# Patient Record
Sex: Female | Born: 1955 | Race: Black or African American | Hispanic: No | Smoking: Never smoker
Health system: Southern US, Community
[De-identification: ages and names within clinical notes are randomized; demographics above are authoritative.]

## PROBLEM LIST (undated history)

## (undated) DIAGNOSIS — F419 Anxiety disorder, unspecified: Secondary | ICD-10-CM

## (undated) DIAGNOSIS — F32A Depression, unspecified: Secondary | ICD-10-CM

## (undated) DIAGNOSIS — F329 Major depressive disorder, single episode, unspecified: Secondary | ICD-10-CM

## (undated) HISTORY — DX: Depression, unspecified: F32.A

## (undated) HISTORY — PX: GASTRIC BYPASS: SHX52

## (undated) HISTORY — DX: Anxiety disorder, unspecified: F41.9

## (undated) HISTORY — DX: Major depressive disorder, single episode, unspecified: F32.9

---

## 2015-11-07 ENCOUNTER — Encounter (INDEPENDENT_AMBULATORY_CARE_PROVIDER_SITE_OTHER): Payer: Self-pay

## 2015-11-07 ENCOUNTER — Ambulatory Visit: Payer: Self-pay

## 2016-11-12 ENCOUNTER — Encounter: Payer: Self-pay | Admitting: Pharmacist

## 2016-12-17 ENCOUNTER — Ambulatory Visit: Payer: Self-pay | Admitting: Pharmacist

## 2016-12-17 ENCOUNTER — Encounter (INDEPENDENT_AMBULATORY_CARE_PROVIDER_SITE_OTHER): Payer: Self-pay

## 2016-12-17 ENCOUNTER — Encounter: Payer: Self-pay | Admitting: Pharmacist

## 2016-12-17 VITALS — BP 122/80 | Ht 62.0 in | Wt 169.0 lb

## 2016-12-17 DIAGNOSIS — Z79899 Other long term (current) drug therapy: Secondary | ICD-10-CM

## 2016-12-17 NOTE — Progress Notes (Addendum)
  [facility] Visit Note  Patient: Caroline Serrano MRN: [id number] Date of Birth: 1956-07-16 PCP: Sandrea Hughsubio, Jessica, NP   Caroline Serrano 61 y.o. female presents for an initial MTM visit today.  BP 122/80   Ht 5\' 2"  (1.575 m)   Wt 169 lb (76.7 kg)   BMI 30.91 kg/m   Patient Information   Past Medical History:  Diagnosis Date  . Anxiety   . Depression       Past Surgical History:  Procedure Laterality Date  . GASTRIC BYPASS       Family History  Problem Relation Age of Onset  . Hypertension Mother   . Stroke Mother   . Hypertension Father   . Diabetes Paternal Grandmother     New Diagnoses (since last visit): N/A  Family Support: recently divorced, has a close friend, lives with a roommate  Lifestyle Diet: eats several small meals Breakfast: scrambled egg toast, ceral Lunch:meat and veggies Dinner:lighter, lots of fruit Drinks:water, only 1 coca-[employer]a   Current Exercise Habits: Home exercise routine;The patient does not participate in regular exercise at present       History  Alcohol Use  . Yes    Comment: only occasional      History  Smoking Status  . Never Smoker  Smokeless Tobacco  . Never Used      Health Maintenance  Topic Date Due  . Hepatitis C Screening  01956-09-13  . HIV Screening  06/26/1970  . TETANUS/TDAP  06/26/1974  . PAP SMEAR  06/26/1976  . MAMMOGRAM  06/26/2005  . COLONOSCOPY  06/26/2005  . ZOSTAVAX  06/27/2015  . INFLUENZA VACCINE  06/02/2016   Prior to Admission medications   Medication Sig Start Date End Date Taking? Authorizing Provider  amitriptyline (ELAVIL) 50 MG tablet Take 100 mg by mouth at bedtime. 06/08/16  Yes Sandrea HughsJessica Rubio, NP  amLODipine (NORVASC) 10 MG tablet Take 10 mg by mouth daily. 10/20/16  Yes Sandrea HughsJessica Rubio, NP  buPROPion (WELLBUTRIN XL) 150 MG 24 hr tablet Take 150 mg by mouth daily. 06/08/16  Yes Sandrea HughsJessica Rubio, NP  busPIRone (BUSPAR) 15 MG tablet Take 15 mg by mouth 3 (three) times  daily.   Yes Historical Provider, MD  lisinopril (PRINIVIL,ZESTRIL) 40 MG tablet Take 40 mg by mouth daily. 01/01/16  Yes Sandrea HughsJessica Rubio, NP  venlafaxine XR (EFFEXOR-XR) 75 MG 24 hr capsule Take 225 mg by mouth daily with breakfast. 01/02/16  Yes Sandrea HughsJessica Rubio, NP     Assessment and Plan:  Compliance/Adherance:  Pt uses pillbox to organize medications. She knew how she takes each medication and we reviewed what each medication is for. Counseled on the importance of adherence and a goal going forward is to obtain refills on time so there are no gaps in therapy. Requested if pt has anxiety to call me and I will be happy to help her personally.  HTN: pt BP today is under goal of 130/80. Current regimen jis effective and pt is well controlled.  Depression/Anxiety: pt feels that she is well controlled with minimal SEs, but has noticed weight gain. This could be due to amitriptyline and I will recommend to PCP to change to trazodone which pt has taken before with minimal SEs.  Denice Paradisehristan Saja Bartolini, PharmD, RPh [facility]) [phone]

## 2017-11-23 ENCOUNTER — Telehealth: Payer: Self-pay | Admitting: Pharmacy Technician

## 2017-11-23 NOTE — Telephone Encounter (Signed)
Patient eligible to receive medication assistance at [facility] through 2019, as long as eligibility requirements continue to be met.  [place] [facility]

## 2017-11-30 ENCOUNTER — Other Ambulatory Visit: Payer: Self-pay

## 2017-11-30 ENCOUNTER — Encounter (INDEPENDENT_AMBULATORY_CARE_PROVIDER_SITE_OTHER): Payer: Self-pay

## 2017-11-30 ENCOUNTER — Ambulatory Visit: Payer: Medicaid Other

## 2017-11-30 VITALS — BP 128/78 | Ht 62.0 in | Wt 159.0 lb

## 2017-11-30 DIAGNOSIS — Z79899 Other long term (current) drug therapy: Secondary | ICD-10-CM

## 2017-11-30 NOTE — Progress Notes (Signed)
[facility] Visit Note  Patient: Caroline Serrano MRN: [id number] Date of Birth: 1955-11-16 PCP: Sandrea Hughsubio, Jessica, NP   Caroline Serrano 62 y.o. female presents for a medication therapy management visit today with the pharmacist.  BP 128/78 (BP Location: Right Arm)   Ht 5\' 2"  (1.575 m)   Wt 159 lb (72.1 kg)   BMI 29.08 kg/m   Patient Information   Past Medical History:  Diagnosis Date  . Anxiety   . Depression       Past Surgical History:  Procedure Laterality Date  . GASTRIC BYPASS       Family History  Problem Relation Age of Onset  . Hypertension Mother   . Stroke Mother   . Diabetes Mother   . Hypertension Father   . Diabetes Paternal Grandmother    Outpatient Encounter Medications as of 11/30/2017  Medication Sig  . amLODipine (NORVASC) 10 MG tablet Take 10 mg by mouth daily.  Marland Kitchen. buPROPion (WELLBUTRIN XL) 300 MG 24 hr tablet Take 300 mg by mouth daily.   Marland Kitchen. lisinopril (PRINIVIL,ZESTRIL) 40 MG tablet Take 40 mg by mouth daily.  . metoprolol succinate (TOPROL-XL) 25 MG 24 hr tablet Take 25 mg by mouth daily.  . traZODone (DESYREL) 100 MG tablet Take 100 mg by mouth at bedtime as needed for sleep.  Marland Kitchen. venlafaxine XR (EFFEXOR-XR) 75 MG 24 hr capsule Take 225 mg by mouth daily with breakfast.  . [DISCONTINUED] amitriptyline (ELAVIL) 50 MG tablet Take 100 mg by mouth at bedtime.  . [DISCONTINUED] busPIRone (BUSPAR) 15 MG tablet Take 15 mg by mouth 3 (three) times daily.   No facility-administered encounter medications on file as of 11/30/2017.     Family Support: Good; has good friend that she lives with.   Lifestyle Diet: 2.5 meals a day Breakfast: eggs, toast Dinner: chicken, salad, green beans, potato, rice  Drinks: Coke, water  Snacks: fruit   Current Exercise Habits: The patient does not participate in regular exercise at present  Exercise limited by: None identified    Social History   Substance and Sexual Activity   Alcohol Use Yes   Comment: only occasional      Social History   Tobacco Use  Smoking Status Never Smoker  Smokeless Tobacco Never Used      Health Maintenance  Topic Date Due  . Hepatitis C Screening  01956-01-15  . HIV Screening  06/26/1970  . TETANUS/TDAP  06/26/1974  . PAP SMEAR  06/26/1976  . MAMMOGRAM  06/26/2005  . COLONOSCOPY  06/26/2005  . INFLUENZA VACCINE  06/02/2017   Health Maintenance/Date Completed  Last ED visit: has been 6 years  Last Visit to PCP: [facility]; last saw 2 wks ago  Next Visit to PCP: No follow ups scheduled at this time Specialist Visit: None  Dental Exam: Has appointment next wk  Eye Exam: 04/2017 - has occasional problem with double vision  Pelvic/PAP Exam: 1994 had hysterectomy  Mammogram: 10 yrs since last; has appointment in March DEXA: 10 yrs Colonoscopy: 6 yrs ago - Dr. Seward Carolubio is going to refer to get another one  Flu Vaccine: 2 wks  Pneumonia Vaccine: 10 yrs ago    Assessment and Plan:  Anxiety/Depression: bupropion, venlafaxine. Patient states that she is doing very well on this medication combination. She says that she has not experienced any issues with SE's and that she is very pleased with this combination. Current regimen is appropriate.   Sleep:  trazodone. Patient states that she has some issues with sleep. She states that sometimes she does not sleep well but that the trazodone works well for her. She states that she takes this PRN instead of scheduled.   HTN: amlodipine, lisinopril, metoprolol succinate. Patient's BP is well-controlled today with BP showing 128/78. She is excited by this. She states that she does not have any issues with hypotension.   Patient is a 62 yo female who is compliant with her medications and knows them well. She states that she is very satisfied with her current regimens. She is currently being followed [facility] by FNP Sandrea Hughs who is  managing her medications and arranging referrals for her to get her health maintenance visits (colonoscopy, etc). Patient is doing very well and is invested in her own care. As patient is very stable, I scheduled a 1 yr follow up MTM for her.   Yolanda Bonine, PharmD Pharmacy Resident

## 2018-01-21 ENCOUNTER — Other Ambulatory Visit: Payer: Self-pay | Admitting: Primary Care

## 2018-01-21 DIAGNOSIS — Z1239 Encounter for other screening for malignant neoplasm of breast: Secondary | ICD-10-CM

## 2018-01-24 ENCOUNTER — Ambulatory Visit: Payer: Self-pay | Attending: Oncology | Admitting: *Deleted

## 2018-01-24 ENCOUNTER — Ambulatory Visit
Admission: RE | Admit: 2018-01-24 | Discharge: 2018-01-24 | Disposition: A | Discharge status: Home / Self Care | Payer: Self-pay | Source: Ambulatory Visit | Attending: Primary Care | Admitting: Primary Care

## 2018-01-24 DIAGNOSIS — Z0000 Encounter for general adult medical examination without abnormal findings: Secondary | ICD-10-CM

## 2018-01-24 DIAGNOSIS — Z1231 Encounter for screening mammogram for malignant neoplasm of breast: Secondary | ICD-10-CM | POA: Insufficient documentation

## 2018-01-24 DIAGNOSIS — Z1239 Encounter for other screening for malignant neoplasm of breast: Secondary | ICD-10-CM

## 2018-01-24 NOTE — Progress Notes (Signed)
Patient was rescreened for [facility] eligibility and was not eligible since he is a transgender female.  Mammogram will be provided through [employer] since he meet financial eligibility.  Will follow up to see if mammogram is normal navigate if appropriate.

## 2018-01-28 ENCOUNTER — Other Ambulatory Visit: Payer: Self-pay | Admitting: *Deleted

## 2018-01-28 ENCOUNTER — Inpatient Hospital Stay
Admission: RE | Admit: 2018-01-28 | Discharge: 2018-01-28 | Disposition: A | Discharge status: Home / Self Care | Payer: Self-pay | Source: Ambulatory Visit | Attending: *Deleted | Admitting: *Deleted

## 2018-01-28 DIAGNOSIS — Z9289 Personal history of other medical treatment: Secondary | ICD-10-CM

## 2018-12-01 ENCOUNTER — Encounter (INDEPENDENT_AMBULATORY_CARE_PROVIDER_SITE_OTHER): Payer: Self-pay

## 2018-12-01 ENCOUNTER — Other Ambulatory Visit: Payer: Self-pay

## 2018-12-01 ENCOUNTER — Telehealth: Payer: Self-pay | Admitting: Pharmacy Technician

## 2018-12-01 ENCOUNTER — Encounter: Payer: Self-pay | Admitting: Pharmacist

## 2018-12-01 ENCOUNTER — Ambulatory Visit: Payer: Medicaid Other | Admitting: Pharmacist

## 2018-12-01 VITALS — BP 130/83 | Ht 62.0 in | Wt 160.0 lb

## 2018-12-01 DIAGNOSIS — Z79899 Other long term (current) drug therapy: Secondary | ICD-10-CM

## 2018-12-01 NOTE — Telephone Encounter (Signed)
Received 2020 proof of income.  Patient eligible to receive medication assistance at [facility] as long as eligibility requirements continue to be met.  [place] [facility]

## 2018-12-01 NOTE — Progress Notes (Addendum)
[facility] Visit Note  Patient: Caroline Serrano MRN: [id number] Date of Birth: 1956-07-18 PCP: Caroline Hughs, NP   Caroline Serrano 63 y.o. transgender female presents for a yearly f/u MTM visit today.  BP 130/83 (BP Location: Right Arm, Patient Position: Sitting, Cuff Size: Normal)   Ht 5\' 2"  (1.575 m)   Wt 160 lb (72.6 kg)   BMI 29.26 kg/m   Patient Information   Past Medical History:  Diagnosis Date  . Anxiety   . Depression       Past Surgical History:  Procedure Laterality Date  . GASTRIC BYPASS       Family History  Problem Relation Age of Onset  . Hypertension Mother   . Stroke Mother   . Diabetes Mother   . Hypertension Father   . Diabetes Paternal Grandmother             Social History   Substance and Sexual Activity  Alcohol Use Yes   Comment: only occasional      Social History   Tobacco Use  Smoking Status Never Smoker  Smokeless Tobacco Never Used      Health Maintenance  Topic Date Due  . Hepatitis C Screening  1956/08/29  . HIV Screening  06/26/1970  . TETANUS/TDAP  06/26/1974  . PAP SMEAR-Modifier  06/26/1976  . COLONOSCOPY  06/26/2005  . INFLUENZA VACCINE  06/02/2018  . MAMMOGRAM  01/25/2020   Health Maintenance/Date Completed  Last ED visit: n/a Last Visit to PCP: dec Next Visit to PCP: w/in 3 mos Specialist Visit:neurologist (double vision); specialist about testosterone levels (high) Dental Exam:  3 weeks ago Eye Exam: Oct 2019 Mammogram: 12/2017 DEXA: not recently Colonoscopy: w/in last 5 years Flu Vaccine: 06/2018 Pneumonia Vaccine: 10 years   should have sent ref back in nov - send req to rubio  Outpatient Encounter Medications as of 12/01/2018  Medication Sig  . amLODipine (NORVASC) 10 MG tablet Take 10 mg by mouth daily.  Marland Kitchen aspirin EC 81 MG tablet Take 81 mg by mouth daily.  Marland Kitchen buPROPion (WELLBUTRIN XL) 300 MG 24 hr tablet Take 300 mg by mouth daily.   .  Cyanocobalamin-Salcaprozate (ELIGEN B12) 1000-100 MCG-MG TABS Take 1,000 mcg by mouth every morning.  Marland Kitchen lisinopril (PRINIVIL,ZESTRIL) 40 MG tablet Take 40 mg by mouth daily.  . metoprolol succinate (TOPROL-XL) 25 MG 24 hr tablet Take 25 mg by mouth daily.  Marland Kitchen testosterone cypionate (DEPO-TESTOSTERONE) 200 MG/ML injection Inject 40 mg as directed once a week.  . traZODone (DESYREL) 100 MG tablet Take 100 mg by mouth at bedtime as needed for sleep.  Marland Kitchen venlafaxine XR (EFFEXOR-XR) 75 MG 24 hr capsule Take 225 mg by mouth daily with breakfast.   No facility-administered encounter medications on file as of 12/01/2018.     Assessment and Plan:  Compliance/Adherance: knows names, frequencies, and indications of all medications with very little prompting. States only misses doses maybe once every 3 months. Consistent getting refills on time. Patient is doing very well and is invested in own care.  Anxiety/Depression: bupropion, venlafaxine, hydroxyzine 10mg  prn added in Dec/2019. Patient states that he is doing very well on this medication combination. States has not experienced any issues with SE's Current regimen is appropriate. uses the hydroxyzine only as needed and it helps  Sleep: trazodone.  States takes this PRN instead of scheduled and has not had to use it very often.  HTN: amlodipine, lisinopril, metoprolol succinate. Patient's BP is well-controlled today at 130/83,  right at goal of <130/80.   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 States that SEs are not a problem but levels have been running higher than a cisgender males would and so he has been referred to a specialist.  RTC: 1 year  Denice Paradisehristan , PharmD, RPh [facility]) [phone]

## 2019-12-07 ENCOUNTER — Other Ambulatory Visit: Payer: Medicaid Other

## 2020-01-01 ENCOUNTER — Other Ambulatory Visit: Payer: Self-pay

## 2020-01-01 ENCOUNTER — Ambulatory Visit: Payer: Medicaid Other

## 2020-01-01 DIAGNOSIS — Z79899 Other long term (current) drug therapy: Secondary | ICD-10-CM

## 2020-01-01 NOTE — Progress Notes (Signed)
[facility] Visit Note  Patient: Caroline Serrano MRN: [id number] Date of Birth: Jul 23, 1956 PCP: Freddy Finner, NP   Rutherford Limerick 64 y.o. adult presents for a telephone medication therapy management visit with the pharmacist today. Patient identity confirmed using two patient identifiers.   There were no vitals taken for this visit.  Patient Information   Past Medical History:  Diagnosis Date  . Anxiety   . Depression       Past Surgical History:  Procedure Laterality Date  . GASTRIC BYPASS       Family History  Problem Relation Age of Onset  . Hypertension Mother   . Stroke Mother   . Diabetes Mother   . Hypertension Father   . Diabetes Paternal Grandmother     New Diagnoses (since last visit): None    Social History   Substance and Sexual Activity  Alcohol Use Yes   Comment: only occasional   1.5 drinks/week (beer)   Social History   Tobacco Use  Smoking Status Never Smoker  Smokeless Tobacco Never Used   Denies tobacco use and illicit substance use   Health Maintenance  Topic Date Due  . Hepatitis C Screening  [birth year]  . HIV Screening  06/26/1970  . TETANUS/TDAP  06/26/1974  . PAP SMEAR-Modifier  06/26/1976  . COLONOSCOPY  06/26/2005  . INFLUENZA VACCINE  06/03/2019  . MAMMOGRAM  01/25/2020   Outpatient Encounter Medications as of 01/01/2020  Medication Sig  . amLODipine (NORVASC) 10 MG tablet Take 10 mg by mouth daily.  Marland Kitchen aspirin EC 81 MG tablet Take 81 mg by mouth daily.  . Biotin 1 MG CAPS Take 5 mg by mouth 2 (two) times daily.  Marland Kitchen buPROPion (WELLBUTRIN XL) 300 MG 24 hr tablet Take 300 mg by mouth daily.   . chlorthalidone (HYGROTON) 25 MG tablet Take 50 mg by mouth daily.  . cyanocobalamin 1000 MCG tablet Take 5,000 mcg by mouth daily.  . hydrOXYzine (ATARAX/VISTARIL) 10 MG tablet Take 10 mg by mouth 3 (three) times daily as needed.  Marland Kitchen lisinopril (PRINIVIL,ZESTRIL) 40 MG tablet Take 40 mg by mouth  daily.  Marland Kitchen testosterone cypionate (DEPO-TESTOSTERONE) 200 MG/ML injection Inject 6 mg as directed once a week.   . traZODone (DESYREL) 100 MG tablet Take 100 mg by mouth at bedtime as needed for sleep.  Marland Kitchen venlafaxine XR (EFFEXOR-XR) 75 MG 24 hr capsule Take 225 mg by mouth daily with breakfast.  . metoprolol succinate (TOPROL-XL) 25 MG 24 hr tablet Take 25 mg by mouth daily.  . [DISCONTINUED] Cyanocobalamin-Salcaprozate (ELIGEN B12) 1000-100 MCG-MG TABS Take 1,000 mcg by mouth every morning.   No facility-administered encounter medications on file as of 01/01/2020.    Health Maintenance/Date Completed  Last ED visit: Denies recent ED visits Last Visit to PCP: ~1 month ago Next Visit to PCP: Appointments q2-3 months Specialist Visit: N/A Dental Exam: Yes Eye Exam: Wears contacts.  Pelvic/PAP Exam: Did not ask Mammogram: Did not ask DEXA: History of osteoporosis. No history of fractures. Has been on bisphosphonate in the past.  Colonoscopy: Due Flu Vaccine: Yes Pneumonia Vaccine: Yes COVID-19 Vaccine: Never Shingrix Vaccine: Never  Assessment and Plan:  Compliance/Adherance:  -Not requiring refills on medications at this time -Reports adherence to prescribed medications -Does not utilize a pillbox but has a system that works  Anxiety/Depression: bupropion, venlafaxine, hydroxyzine 10mg  prn added in Dec/2019. Patient reports symptoms are well-controlled. No adverse effects of therapy.  Sleep: trazodone.  Takes this PRN and  not requiring frequently.  HTN: amlodipine, lisinopril, chlorthalidone. Checks blood pressure every other day. Reports that it typically ranges 140s/80s, goal <130/80. Denies signs/symptoms of hypotension (dizziness, faintness) or hypertension (headache, facial flushing). Encouraged patient to stay well hydrated while on chlorthalidone. Counseled patient on decreasing foods high in sodium.  Vitamin B12 deficiency: Cyanocobalamin 5000 mcg daily  Gender  transition: on depo-testosterone 6 mg weekly.  RTC: 1 year  Dorothea Ogle Pharmacy Resident 01 January 2020

## 2020-01-03 ENCOUNTER — Other Ambulatory Visit: Payer: Self-pay

## 2020-03-04 ENCOUNTER — Telehealth: Payer: Self-pay | Admitting: Pharmacy Technician

## 2020-03-04 NOTE — Telephone Encounter (Signed)
Received updated proof of income.  Patient eligible to receive medication assistance at [facility] until June 02, 2020.  Patient eligible for Medicare Part D beginning June 02, 2020.  Patient provided with information about how to sign-up for L.I.S and to be screened for a Medicare Savings Plan.  Sherilyn Dacosta Care Manager [facility]

## 2020-07-11 ENCOUNTER — Other Ambulatory Visit: Payer: Self-pay | Admitting: Primary Care

## 2020-07-11 DIAGNOSIS — Z0000 Encounter for general adult medical examination without abnormal findings: Secondary | ICD-10-CM

## 2020-09-05 ENCOUNTER — Ambulatory Visit
Admission: RE | Admit: 2020-09-05 | Discharge: 2020-09-05 | Disposition: A | Discharge status: Home / Self Care | Payer: Medicare HMO | Source: Ambulatory Visit | Attending: Primary Care | Admitting: Primary Care

## 2020-09-05 ENCOUNTER — Other Ambulatory Visit: Payer: Self-pay

## 2020-09-05 DIAGNOSIS — Z780 Asymptomatic menopausal state: Secondary | ICD-10-CM | POA: Diagnosis not present

## 2020-09-05 DIAGNOSIS — Z0000 Encounter for general adult medical examination without abnormal findings: Secondary | ICD-10-CM

## 2020-09-05 DIAGNOSIS — M8589 Other specified disorders of bone density and structure, multiple sites: Secondary | ICD-10-CM | POA: Insufficient documentation

## 2020-09-05 DIAGNOSIS — Z13820 Encounter for screening for osteoporosis: Secondary | ICD-10-CM | POA: Diagnosis not present

## 2020-11-05 ENCOUNTER — Emergency Department: Payer: Medicare HMO

## 2020-11-05 ENCOUNTER — Inpatient Hospital Stay
Admission: EM | Admit: 2020-11-05 | Discharge: 2020-11-09 | DRG: 917 | Disposition: A | Discharge status: Home Health | Payer: Medicare HMO | Attending: Internal Medicine | Admitting: Internal Medicine

## 2020-11-05 DIAGNOSIS — T50992A Poisoning by other drugs, medicaments and biological substances, intentional self-harm, initial encounter: Secondary | ICD-10-CM | POA: Diagnosis not present

## 2020-11-05 DIAGNOSIS — E876 Hypokalemia: Secondary | ICD-10-CM | POA: Diagnosis present

## 2020-11-05 DIAGNOSIS — Z8249 Family history of ischemic heart disease and other diseases of the circulatory system: Secondary | ICD-10-CM

## 2020-11-05 DIAGNOSIS — Z20822 Contact with and (suspected) exposure to covid-19: Secondary | ICD-10-CM | POA: Diagnosis present

## 2020-11-05 DIAGNOSIS — R569 Unspecified convulsions: Secondary | ICD-10-CM | POA: Diagnosis present

## 2020-11-05 DIAGNOSIS — Z23 Encounter for immunization: Secondary | ICD-10-CM

## 2020-11-05 DIAGNOSIS — Z79899 Other long term (current) drug therapy: Secondary | ICD-10-CM

## 2020-11-05 DIAGNOSIS — F10229 Alcohol dependence with intoxication, unspecified: Secondary | ICD-10-CM | POA: Diagnosis present

## 2020-11-05 DIAGNOSIS — R4182 Altered mental status, unspecified: Secondary | ICD-10-CM

## 2020-11-05 DIAGNOSIS — X58XXXA Exposure to other specified factors, initial encounter: Secondary | ICD-10-CM | POA: Diagnosis present

## 2020-11-05 DIAGNOSIS — Z7982 Long term (current) use of aspirin: Secondary | ICD-10-CM

## 2020-11-05 DIAGNOSIS — E512 Wernicke's encephalopathy: Secondary | ICD-10-CM

## 2020-11-05 DIAGNOSIS — Z833 Family history of diabetes mellitus: Secondary | ICD-10-CM

## 2020-11-05 DIAGNOSIS — Z9884 Bariatric surgery status: Secondary | ICD-10-CM

## 2020-11-05 DIAGNOSIS — F419 Anxiety disorder, unspecified: Secondary | ICD-10-CM | POA: Diagnosis present

## 2020-11-05 DIAGNOSIS — R748 Abnormal levels of other serum enzymes: Secondary | ICD-10-CM | POA: Diagnosis present

## 2020-11-05 DIAGNOSIS — F1010 Alcohol abuse, uncomplicated: Secondary | ICD-10-CM | POA: Diagnosis not present

## 2020-11-05 DIAGNOSIS — R45851 Suicidal ideations: Secondary | ICD-10-CM | POA: Diagnosis present

## 2020-11-05 DIAGNOSIS — Z820 Family history of epilepsy and other diseases of the nervous system: Secondary | ICD-10-CM

## 2020-11-05 DIAGNOSIS — Y92009 Unspecified place in unspecified non-institutional (private) residence as the place of occurrence of the external cause: Secondary | ICD-10-CM

## 2020-11-05 DIAGNOSIS — Z823 Family history of stroke: Secondary | ICD-10-CM

## 2020-11-05 DIAGNOSIS — I10 Essential (primary) hypertension: Secondary | ICD-10-CM | POA: Diagnosis present

## 2020-11-05 DIAGNOSIS — S0101XA Laceration without foreign body of scalp, initial encounter: Secondary | ICD-10-CM | POA: Diagnosis present

## 2020-11-05 DIAGNOSIS — G9341 Metabolic encephalopathy: Secondary | ICD-10-CM | POA: Diagnosis present

## 2020-11-05 DIAGNOSIS — F10939 Alcohol use, unspecified with withdrawal, unspecified: Secondary | ICD-10-CM

## 2020-11-05 DIAGNOSIS — A419 Sepsis, unspecified organism: Secondary | ICD-10-CM

## 2020-11-05 DIAGNOSIS — R6510 Systemic inflammatory response syndrome (SIRS) of non-infectious origin without acute organ dysfunction: Secondary | ICD-10-CM | POA: Diagnosis present

## 2020-11-05 DIAGNOSIS — K709 Alcoholic liver disease, unspecified: Secondary | ICD-10-CM | POA: Diagnosis present

## 2020-11-05 DIAGNOSIS — Z886 Allergy status to analgesic agent status: Secondary | ICD-10-CM

## 2020-11-05 DIAGNOSIS — F332 Major depressive disorder, recurrent severe without psychotic features: Secondary | ICD-10-CM | POA: Diagnosis present

## 2020-11-05 DIAGNOSIS — F1020 Alcohol dependence, uncomplicated: Secondary | ICD-10-CM

## 2020-11-05 DIAGNOSIS — F10239 Alcohol dependence with withdrawal, unspecified: Secondary | ICD-10-CM | POA: Diagnosis present

## 2020-11-05 DIAGNOSIS — E038 Other specified hypothyroidism: Secondary | ICD-10-CM | POA: Diagnosis present

## 2020-11-05 DIAGNOSIS — N179 Acute kidney failure, unspecified: Secondary | ICD-10-CM | POA: Diagnosis present

## 2020-11-05 DIAGNOSIS — Y900 Blood alcohol level of less than 20 mg/100 ml: Secondary | ICD-10-CM | POA: Diagnosis present

## 2020-11-05 LAB — COMPREHENSIVE METABOLIC PANEL
ALT: 73 U/L — ABNORMAL HIGH (ref 0–44)
AST: 144 U/L — ABNORMAL HIGH (ref 15–41)
Albumin: 3.8 g/dL (ref 3.5–5.0)
Alkaline Phosphatase: 128 U/L — ABNORMAL HIGH (ref 38–126)
Anion gap: 15 (ref 5–15)
BUN: 24 mg/dL — ABNORMAL HIGH (ref 8–23)
CO2: 22 mmol/L (ref 22–32)
Calcium: 8.6 mg/dL — ABNORMAL LOW (ref 8.9–10.3)
Chloride: 99 mmol/L (ref 98–111)
Creatinine, Ser: 1.6 mg/dL — ABNORMAL HIGH (ref 0.44–1.00)
GFR, Estimated: 36 mL/min — ABNORMAL LOW (ref >60)
Glucose, Bld: 95 mg/dL (ref 70–99)
Potassium: 3.2 mmol/L — ABNORMAL LOW (ref 3.5–5.1)
Sodium: 136 mmol/L (ref 135–145)
Total Bilirubin: 1.4 mg/dL — ABNORMAL HIGH (ref 0.3–1.2)
Total Protein: 6.4 g/dL — ABNORMAL LOW (ref 6.5–8.1)

## 2020-11-05 LAB — CBC WITH DIFFERENTIAL/PLATELET
Abs Immature Granulocytes: 0.09 10*3/uL — ABNORMAL HIGH (ref 0.00–0.07)
Basophils Absolute: 0 10*3/uL (ref 0.0–0.1)
Basophils Relative: 0 %
Eosinophils Absolute: 0 10*3/uL (ref 0.0–0.5)
Eosinophils Relative: 0 %
HCT: 36.1 % (ref 36.0–46.0)
Hemoglobin: 12.6 g/dL (ref 12.0–15.0)
Immature Granulocytes: 0 %
Lymphocytes Relative: 3 %
Lymphs Abs: 0.7 10*3/uL (ref 0.7–4.0)
MCH: 28.4 pg (ref 26.0–34.0)
MCHC: 34.9 g/dL (ref 30.0–36.0)
MCV: 81.5 fL (ref 80.0–100.0)
Monocytes Absolute: 1.4 10*3/uL — ABNORMAL HIGH (ref 0.1–1.0)
Monocytes Relative: 6 %
Neutro Abs: 19.1 10*3/uL — ABNORMAL HIGH (ref 1.7–7.7)
Neutrophils Relative %: 91 %
Platelets: 297 10*3/uL (ref 150–400)
RBC: 4.43 MIL/uL (ref 3.87–5.11)
RDW: 13.8 % (ref 11.5–15.5)
WBC: 21.3 10*3/uL — ABNORMAL HIGH (ref 4.0–10.5)
nRBC: 0 % (ref 0.0–0.2)

## 2020-11-05 LAB — LACTIC ACID, PLASMA: Lactic Acid, Venous: 3.7 mmol/L (ref 0.5–1.9)

## 2020-11-05 LAB — SALICYLATE LEVEL: Salicylate Lvl: <7 mg/dL — ABNORMAL LOW (ref 7.0–30.0)

## 2020-11-05 LAB — ACETAMINOPHEN LEVEL: Acetaminophen (Tylenol), Serum: <10 ug/mL — ABNORMAL LOW (ref 10–30)

## 2020-11-05 LAB — ETHANOL: Alcohol, Ethyl (B): <10 mg/dL (ref <10)

## 2020-11-05 LAB — LIPASE, BLOOD: Lipase: 17 U/L (ref 11–51)

## 2020-11-05 MED ORDER — THIAMINE HCL 100 MG/ML IJ SOLN
100.0000 mg | Freq: Every day | INTRAMUSCULAR | Status: DC
  Administered 2020-11-05 – 2020-11-06 (×2): 100 mg via INTRAVENOUS
  Filled 2020-11-05 (×4): qty 2

## 2020-11-05 MED ORDER — LACTATED RINGERS IV SOLN: INTRAVENOUS | Status: DC

## 2020-11-05 MED ORDER — TETANUS-DIPHTH-ACELL PERTUSSIS 5-2.5-18.5 LF-MCG/0.5 IM SUSY
0.5000 mL | PREFILLED_SYRINGE | Freq: Once | INTRAMUSCULAR | Status: AC
  Administered 2020-11-05: 0.5 mL via INTRAMUSCULAR
  Filled 2020-11-05: qty 0.5

## 2020-11-05 MED ORDER — LORAZEPAM 2 MG/ML IJ SOLN: 0.0000 mg | Freq: Two times a day (BID) | INTRAMUSCULAR | Status: DC

## 2020-11-05 MED ORDER — LORAZEPAM 2 MG PO TABS
0.0000 mg | ORAL_TABLET | Freq: Four times a day (QID) | ORAL | Status: AC
  Administered 2020-11-07 (×2): 1 mg via ORAL
  Filled 2020-11-05 (×2): qty 1

## 2020-11-05 MED ORDER — LORAZEPAM 2 MG PO TABS: 0.0000 mg | ORAL_TABLET | Freq: Two times a day (BID) | ORAL | Status: DC

## 2020-11-05 MED ORDER — THIAMINE HCL 100 MG PO TABS
100.0000 mg | ORAL_TABLET | Freq: Every day | ORAL | Status: DC
  Administered 2020-11-07 – 2020-11-09 (×3): 100 mg via ORAL
  Filled 2020-11-05 (×3): qty 1

## 2020-11-05 MED ORDER — LACTATED RINGERS IV BOLUS
1000.0000 mL | Freq: Once | INTRAVENOUS | Status: AC
  Administered 2020-11-05: 1000 mL via INTRAVENOUS

## 2020-11-05 MED ORDER — SODIUM CHLORIDE 0.9 % IV SOLN
1.0000 g | Freq: Once | INTRAVENOUS | Status: AC
  Administered 2020-11-05: 1 g via INTRAVENOUS
  Filled 2020-11-05: qty 10

## 2020-11-05 MED ORDER — LORAZEPAM 2 MG/ML IJ SOLN
0.0000 mg | Freq: Four times a day (QID) | INTRAMUSCULAR | Status: AC
  Administered 2020-11-06: 1 mg via INTRAVENOUS
  Filled 2020-11-05 (×3): qty 1

## 2020-11-05 NOTE — ED Triage Notes (Signed)
Patient usually drinks 24 beers daily and today per EMS, she only drank 12.  EMS reports roommate found her on floor with jerking motions.  Appears the same upon arrival.  Non- verbal; but will grab if touched

## 2020-11-05 NOTE — ED Provider Notes (Signed)
11:00 PM  Assumed care. Patient here with encephalopathy.  H/o heavy ETOH abuse.  ETOH negative here.  Possible alcohol withdrawal seizure at home per triage note.  Resting comfortably here now.  12:11 AM Discussed patient's case with hospitalist, Dr. Para March.  I have recommended admission and patient (and family if present) agree with this plan. Admitting physician will place admission orders.   I reviewed all nursing notes, vitals, pertinent previous records and reviewed/interpreted all EKGs, lab and urine results, imaging (as available).     Maleeah Crossman, Layla Maw, DO 11/06/20 [phone]

## 2020-11-05 NOTE — ED Provider Notes (Signed)
[facility] Emergency Department Provider Note  ____________________________________________  Time seen: Approximately 8:50 PM  I have reviewed the triage vital signs and the nursing notes.   HISTORY  Chief Complaint Alcohol Intoxication (EMS reports patient usually drinks 24 beers a day but today only had 12.  Roommate found her on the floor with a jerking motion.  Patient grabs when touched, non-verbal with muscle jerking motions)    Level 5 Caveat: Portions of the History and Physical including HPI and review of systems are unable to be completely obtained due to patient being a poor historian   HPI Caroline Serrano is a 65 y.o. adult with a history of anxiety hypertension and alcohol abuse  who is brought to the ED by EMS due to confusion.  Reportedly her roommate had not seen her all day, and went to check on her and found about 12 empty beer cans around the bed and patient appearing confused.  EMS reports being told patient normally drinks about 24 beers a day.  No known seizure history.     Past Medical History:  Diagnosis Date  . Anxiety   . Depression      There are no problems to display for this patient.    Past Surgical History:  Procedure Laterality Date  . GASTRIC BYPASS       Prior to Admission medications   Medication Sig Start Date End Date Taking? Authorizing Provider  amLODipine (NORVASC) 10 MG tablet Take 10 mg by mouth daily. 10/20/16   Sandrea Hughs, NP  aspirin EC 81 MG tablet Take 81 mg by mouth daily.    [provider]  Biotin 1 MG CAPS Take 5 mg by mouth 2 (two) times daily.    [provider]  buPROPion (WELLBUTRIN XL) 300 MG 24 hr tablet Take 300 mg by mouth daily.  06/08/16   Sandrea Hughs, NP  chlorthalidone (HYGROTON) 25 MG tablet Take 50 mg by mouth daily.    [provider]  cyanocobalamin 1000 MCG tablet Take 5,000 mcg by mouth daily.    [provider]  hydrOXYzine  (ATARAX/VISTARIL) 10 MG tablet Take 10 mg by mouth 3 (three) times daily as needed.    [provider]  lisinopril (PRINIVIL,ZESTRIL) 40 MG tablet Take 40 mg by mouth daily. 01/01/16   Sandrea Hughs, NP  metoprolol succinate (TOPROL-XL) 25 MG 24 hr tablet Take 25 mg by mouth daily.    [provider]  testosterone cypionate (DEPO-TESTOSTERONE) 200 MG/ML injection Inject 6 mg as directed once a week.  09/16/15   [provider]  traZODone (DESYREL) 100 MG tablet Take 100 mg by mouth at bedtime as needed for sleep.    [provider]  venlafaxine XR (EFFEXOR-XR) 75 MG 24 hr capsule Take 225 mg by mouth daily with breakfast. 01/02/16   Sandrea Hughs, NP     Allergies Aspirin and Ibuprofen   Family History  Problem Relation Age of Onset  . Hypertension Mother   . Stroke Mother   . Diabetes Mother   . Hypertension Father   . Diabetes Paternal Grandmother     Social History Social History   Tobacco Use  . Smoking status: Never Smoker  . Smokeless tobacco: Never Used  Vaping Use  . Vaping Use: Never used  Substance Use Topics  . Alcohol use: Yes    Comment: only occasional  . Drug use: No    Review of Systems Level 5 Caveat: Portions of the  History and Physical including HPI and review of systems are unable to be completely obtained due to patient being a poor historian   Constitutional:   No known fever.  ENT:   No rhinorrhea. Cardiovascular:   No chest pain or syncope. Respiratory:   No dyspnea or cough. Gastrointestinal:   Negative for abdominal pain, vomiting and diarrhea.  Musculoskeletal:   Negative for focal pain or swelling ____________________________________________   PHYSICAL EXAM:  VITAL SIGNS: ED Triage Vitals [11/05/20 1959]  Enc Vitals Group     BP (!) 190/154     Pulse Rate (!) 109     Resp (!) 25     Temp      Temp src      SpO2 100 %     Weight      Height      Head Circumference      Peak Flow      Pain Score       Pain Loc      Pain Edu?      Excl. in [place]?     Vital signs reviewed, nursing assessments reviewed.   Constitutional: Awake, not alert, not oriented. Non-toxic appearance. Eyes:   Conjunctivae are normal. EOMI. PERRL. ENT      Head:   Normocephalic with 3 cm linear laceration on the right forehead, hemostatic      Nose:   No congestion/rhinnorhea.       Mouth/Throat:   Dry mucous membranes, no pharyngeal erythema. No peritonsillar mass.       Neck:   No meningismus. Full ROM.  No midline tenderness Hematological/Lymphatic/Immunilogical:   No cervical lymphadenopathy. Cardiovascular:   RRR. Symmetric bilateral radial and DP pulses.  No murmurs. Cap refill less than 2 seconds. Respiratory:   Normal respiratory effort without tachypnea/retractions. Breath sounds are clear and equal bilaterally. No wheezes/rales/rhonchi. Gastrointestinal:   Soft and nontender. Non distended. There is no CVA tenderness.  No rebound, rigidity, or guarding.  Musculoskeletal:   Normal range of motion in all extremities. No joint effusions.  No lower extremity tenderness.  No edema. Neurologic:   No verbal responses Repetitive facial movements consistent with tardive dyskinesia Motor grossly intact, moving all extremities,  No acute focal neurologic deficits are appreciated.  Skin:    Skin is warm, dry and intact. No rash noted.  No petechiae, purpura, or bullae.  ____________________________________________    LABS (pertinent positives/negatives) (all labs ordered are listed, but only abnormal results are displayed) Labs Reviewed  COMPREHENSIVE METABOLIC PANEL - Abnormal; Notable for the following components:      Result Value   Potassium 3.2 (*)    BUN 24 (*)    Creatinine, Ser 1.60 (*)    Calcium 8.6 (*)    Total Protein 6.4 (*)    AST 144 (*)    ALT 73 (*)    Alkaline Phosphatase 128 (*)    Total Bilirubin 1.4 (*)    GFR, Estimated 36 (*)    All other components within normal limits   SALICYLATE LEVEL - Abnormal; Notable for the following components:   Salicylate Lvl <7.4 (*)    All other components within normal limits  ACETAMINOPHEN LEVEL - Abnormal; Notable for the following components:   Acetaminophen (Tylenol), Serum <10 (*)    All other components within normal limits  CBC WITH DIFFERENTIAL/PLATELET - Abnormal; Notable for the following components:   WBC 21.3 (*)    Neutro Abs 19.1 (*)    Monocytes Absolute  1.4 (*)    Abs Immature Granulocytes 0.09 (*)    All other components within normal limits  CULTURE, BLOOD (SINGLE)  ETHANOL  LIPASE, BLOOD  URINALYSIS, COMPLETE (UACMP) WITH MICROSCOPIC  URINE DRUG SCREEN, QUALITATIVE ([facility] ONLY)  LACTIC ACID, PLASMA  LACTIC ACID, PLASMA   ____________________________________________   EKG    ____________________________________________    RADIOLOGY  DG Chest 1 View  Result Date: 11/05/2020 CLINICAL DATA:  Altered mental status, intoxication EXAM: CHEST  1 VIEW COMPARISON:  None. FINDINGS: Normal heart size. Normal mediastinal contour. No pneumothorax. No pleural effusion. Lungs appear clear, with no acute consolidative airspace disease and no pulmonary edema. IMPRESSION: No active disease. Electronically Signed   By: Delbert Phenix M.D.   On: 11/05/2020 20:07   CT HEAD WO CONTRAST  Result Date: 11/05/2020 CLINICAL DATA:  Nonverbal jerking motion EXAM: CT HEAD WITHOUT CONTRAST TECHNIQUE: Contiguous axial images were obtained from the base of the skull through the vertex without intravenous contrast. COMPARISON:  None. FINDINGS: Brain: No acute territorial infarction, hemorrhage or intracranial mass. Mild to moderate atrophy. Nonenlarged ventricles. Vascular: No hyperdense vessels.  Carotid vascular calcification Skull: Normal. Negative for fracture or focal lesion. Sinuses/Orbits: No acute finding. Other: Mild right forehead scalp edema. Incomplete union posterior arch C1. Hypoplastic appearing right mandibular head.  IMPRESSION: 1. No CT evidence for acute intracranial abnormality. 2. Atrophy. Electronically Signed   By: Jasmine Pang M.D.   On: 11/05/2020 20:36    ____________________________________________   PROCEDURES .Critical Care Performed by: Sharman Cheek, MD Authorized by: Sharman Cheek, MD   Critical care provider statement:    Critical care time (minutes):  33   Critical care time was exclusive of:  Separately billable procedures and treating other patients   Critical care was necessary to treat or prevent imminent or life-threatening deterioration of the following conditions:  Sepsis and CNS failure or compromise   Critical care was time spent personally by me on the following activities:  Development of treatment plan with patient or surrogate, discussions with consultants, evaluation of patient's response to treatment, examination of patient, obtaining history from patient or surrogate, ordering and performing treatments and interventions, ordering and review of laboratory studies, ordering and review of radiographic studies, pulse oximetry, re-evaluation of patient's condition and review of old charts Comments:        Marland KitchenMarland KitchenLaceration Repair  Date/Time: 11/05/2020 10:40 PM Performed by: Sharman Cheek, MD Authorized by: Sharman Cheek, MD   Consent:    Consent obtained:  Verbal   Consent given by:  Patient   Risks discussed:  Infection, pain, retained foreign body, poor cosmetic result and poor wound healing   Alternatives discussed:  No treatment Universal protocol:    Patient identity confirmed:  Verbally with patient and arm band Anesthesia:    Anesthesia method:  None Laceration details:    Location:  Scalp   Scalp location:  Frontal   Length (cm):  3 Pre-procedure details:    Preparation:  Patient was prepped and draped in usual sterile fashion and imaging obtained to evaluate for foreign bodies Exploration:    Hemostasis achieved with:  Direct pressure    Imaging outcome: foreign body not noted     Wound exploration: entire depth of wound visualized     Wound extent: no foreign bodies/material noted, no muscle damage noted and no vascular damage noted     Contaminated: no   Treatment:    Area cleansed with:  Saline and povidone-iodine   Amount of  cleaning:  Extensive   Irrigation solution:  Sterile saline   Visualized foreign bodies/material removed: no     Debridement:  None   Undermining:  None Skin repair:    Repair method:  Tissue adhesive Approximation:    Approximation:  Close Repair type:    Repair type:  Simple Post-procedure details:    Dressing:  Sterile dressing   Procedure completion:  Tolerated well, no immediate complications    ____________________________________________  DIFFERENTIAL DIAGNOSIS   Intracranial hemorrhage, dehydration, electrolyte abnormality, alcohol intoxication, seizure  CLINICAL IMPRESSION / ASSESSMENT AND PLAN / ED COURSE  Medications ordered in the ED: Medications  LORazepam (ATIVAN) injection 0-4 mg (0 mg Intravenous Hold 11/05/20 2059)    Or  LORazepam (ATIVAN) tablet 0-4 mg ( Oral See Alternative 11/05/20 2059)  LORazepam (ATIVAN) injection 0-4 mg (has no administration in time range)    Or  LORazepam (ATIVAN) tablet 0-4 mg (has no administration in time range)  thiamine tablet 100 mg ( Oral See Alternative 11/05/20 2047)    Or  thiamine (B-1) injection 100 mg (100 mg Intravenous Given 11/05/20 2047)  cefTRIAXone (ROCEPHIN) 1 g in sodium chloride 0.9 % 100 mL IVPB (1 g Intravenous New Bag/Given 11/05/20 2214)  Tdap (BOOSTRIX) injection 0.5 mL (0.5 mLs Intramuscular Given 11/05/20 2052)  lactated ringers bolus 1,000 mL (1,000 mLs Intravenous New Bag/Given 11/05/20 2211)    Pertinent labs & imaging results that were available during my care of the patient were reviewed by me and considered in my medical decision making (see chart for details).   Cheney Beretta Ginsberg was evaluated in  Emergency Department on 11/05/2020 for the symptoms described in the history of present illness. She was evaluated in the context of the global COVID-19 pandemic, which necessitated consideration that the patient might be at risk for infection with the SARS-CoV-2 virus that causes COVID-19. Institutional protocols and algorithms that pertain to the evaluation of patients at risk for COVID-19 are in a state of rapid change based on information released by regulatory bodies including the CDC and federal and state organizations. These policies and algorithms were followed during the patient's care in the ED.   Patient brought to ED with altered mental status.  By report she heavily abuses alcohol but may have had less today.  There is evidence of head trauma.  Will obtain CT scan, check labs, CIWA protocol  Clinical Course as of 11/05/20 2242  Tue Nov 05, 2020  2135 Chest x-ray unremarkable.  CT head overall unremarkable, does show some atrophy consistent with chronic alcoholism [PS]  2137 WBC(!): 21.3 Patient has leukocytosis of 21,000.  With tachycardia and tachypnea, raises suspicion for infection and sepsis.  No signs of pneumonia, doubt meningitis or encephalitis, no evidence of skin or soft tissue infection.  High suspicion for UTI.  Will check lactate, blood culture, give Rocephin. [PS]    Clinical Course User Index [PS] Sharman Cheek, MD     ____________________________________________   FINAL CLINICAL IMPRESSION(S) / ED DIAGNOSES    Final diagnoses:  Altered mental status, unspecified altered mental status type     ED Discharge Orders    None      Portions of this note were generated with dragon dictation software. Dictation errors may occur despite best attempts at proofreading.   Sharman Cheek, MD 11/05/20 2242

## 2020-11-05 NOTE — ED Notes (Signed)
Patient has returned from  Sleeping and lying quietly.  Although CI(wa score was high upon arrival, will withhold Ativan at this time

## 2020-11-06 ENCOUNTER — Inpatient Hospital Stay: Payer: Medicare HMO

## 2020-11-06 ENCOUNTER — Encounter: Payer: Self-pay | Admitting: Internal Medicine

## 2020-11-06 ENCOUNTER — Other Ambulatory Visit: Payer: Self-pay

## 2020-11-06 DIAGNOSIS — E876 Hypokalemia: Secondary | ICD-10-CM | POA: Diagnosis present

## 2020-11-06 DIAGNOSIS — G9341 Metabolic encephalopathy: Secondary | ICD-10-CM | POA: Diagnosis present

## 2020-11-06 DIAGNOSIS — Z833 Family history of diabetes mellitus: Secondary | ICD-10-CM | POA: Diagnosis not present

## 2020-11-06 DIAGNOSIS — F10229 Alcohol dependence with intoxication, unspecified: Secondary | ICD-10-CM | POA: Diagnosis present

## 2020-11-06 DIAGNOSIS — I10 Essential (primary) hypertension: Secondary | ICD-10-CM

## 2020-11-06 DIAGNOSIS — R748 Abnormal levels of other serum enzymes: Secondary | ICD-10-CM | POA: Diagnosis present

## 2020-11-06 DIAGNOSIS — R569 Unspecified convulsions: Secondary | ICD-10-CM

## 2020-11-06 DIAGNOSIS — E872 Acidosis, unspecified: Secondary | ICD-10-CM | POA: Insufficient documentation

## 2020-11-06 DIAGNOSIS — F419 Anxiety disorder, unspecified: Secondary | ICD-10-CM

## 2020-11-06 DIAGNOSIS — F1010 Alcohol abuse, uncomplicated: Secondary | ICD-10-CM

## 2020-11-06 DIAGNOSIS — Z79899 Other long term (current) drug therapy: Secondary | ICD-10-CM | POA: Diagnosis not present

## 2020-11-06 DIAGNOSIS — Z823 Family history of stroke: Secondary | ICD-10-CM | POA: Diagnosis not present

## 2020-11-06 DIAGNOSIS — F332 Major depressive disorder, recurrent severe without psychotic features: Secondary | ICD-10-CM | POA: Diagnosis present

## 2020-11-06 DIAGNOSIS — Z820 Family history of epilepsy and other diseases of the nervous system: Secondary | ICD-10-CM | POA: Diagnosis not present

## 2020-11-06 DIAGNOSIS — E038 Other specified hypothyroidism: Secondary | ICD-10-CM | POA: Diagnosis present

## 2020-11-06 DIAGNOSIS — X58XXXA Exposure to other specified factors, initial encounter: Secondary | ICD-10-CM | POA: Diagnosis present

## 2020-11-06 DIAGNOSIS — R4182 Altered mental status, unspecified: Secondary | ICD-10-CM | POA: Diagnosis not present

## 2020-11-06 DIAGNOSIS — E512 Wernicke's encephalopathy: Secondary | ICD-10-CM | POA: Diagnosis not present

## 2020-11-06 DIAGNOSIS — S0101XA Laceration without foreign body of scalp, initial encounter: Secondary | ICD-10-CM

## 2020-11-06 DIAGNOSIS — Y900 Blood alcohol level of less than 20 mg/100 ml: Secondary | ICD-10-CM | POA: Diagnosis present

## 2020-11-06 DIAGNOSIS — F10239 Alcohol dependence with withdrawal, unspecified: Secondary | ICD-10-CM

## 2020-11-06 DIAGNOSIS — Z7982 Long term (current) use of aspirin: Secondary | ICD-10-CM | POA: Diagnosis not present

## 2020-11-06 DIAGNOSIS — R6510 Systemic inflammatory response syndrome (SIRS) of non-infectious origin without acute organ dysfunction: Secondary | ICD-10-CM | POA: Diagnosis present

## 2020-11-06 DIAGNOSIS — R45851 Suicidal ideations: Secondary | ICD-10-CM | POA: Diagnosis present

## 2020-11-06 DIAGNOSIS — T50992A Poisoning by other drugs, medicaments and biological substances, intentional self-harm, initial encounter: Secondary | ICD-10-CM | POA: Diagnosis present

## 2020-11-06 DIAGNOSIS — Z23 Encounter for immunization: Secondary | ICD-10-CM | POA: Diagnosis not present

## 2020-11-06 DIAGNOSIS — F1020 Alcohol dependence, uncomplicated: Secondary | ICD-10-CM

## 2020-11-06 DIAGNOSIS — K709 Alcoholic liver disease, unspecified: Secondary | ICD-10-CM | POA: Diagnosis present

## 2020-11-06 DIAGNOSIS — Z8249 Family history of ischemic heart disease and other diseases of the circulatory system: Secondary | ICD-10-CM | POA: Diagnosis not present

## 2020-11-06 DIAGNOSIS — Z886 Allergy status to analgesic agent status: Secondary | ICD-10-CM | POA: Diagnosis not present

## 2020-11-06 DIAGNOSIS — Y92009 Unspecified place in unspecified non-institutional (private) residence as the place of occurrence of the external cause: Secondary | ICD-10-CM | POA: Diagnosis not present

## 2020-11-06 DIAGNOSIS — N179 Acute kidney failure, unspecified: Secondary | ICD-10-CM | POA: Diagnosis present

## 2020-11-06 DIAGNOSIS — Z20822 Contact with and (suspected) exposure to covid-19: Secondary | ICD-10-CM | POA: Diagnosis present

## 2020-11-06 DIAGNOSIS — A419 Sepsis, unspecified organism: Secondary | ICD-10-CM

## 2020-11-06 LAB — URINALYSIS, COMPLETE (UACMP) WITH MICROSCOPIC
Bacteria, UA: NONE SEEN
Bilirubin Urine: NEGATIVE
Glucose, UA: NEGATIVE mg/dL
Ketones, ur: NEGATIVE mg/dL
Leukocytes,Ua: NEGATIVE
Nitrite: NEGATIVE
Protein, ur: NEGATIVE mg/dL
Specific Gravity, Urine: 1.012 (ref 1.005–1.030)
Squamous Epithelial / HPF: NONE SEEN (ref 0–5)
pH: 5 (ref 5.0–8.0)

## 2020-11-06 LAB — CBC
HCT: 33.4 % — ABNORMAL LOW (ref 36.0–46.0)
HCT: 33.4 % — ABNORMAL LOW (ref 36.0–46.0)
Hemoglobin: 11.8 g/dL — ABNORMAL LOW (ref 12.0–15.0)
Hemoglobin: 11.8 g/dL — ABNORMAL LOW (ref 12.0–15.0)
MCH: 28.5 pg (ref 26.0–34.0)
MCH: 28.3 pg (ref 26.0–34.0)
MCHC: 35.3 g/dL (ref 30.0–36.0)
MCHC: 35.3 g/dL (ref 30.0–36.0)
MCV: 80.7 fL (ref 80.0–100.0)
MCV: 80.1 fL (ref 80.0–100.0)
Platelets: 279 10*3/uL (ref 150–400)
Platelets: 286 10*3/uL (ref 150–400)
RBC: 4.14 MIL/uL (ref 3.87–5.11)
RBC: 4.17 MIL/uL (ref 3.87–5.11)
RDW: 13.6 % (ref 11.5–15.5)
RDW: 13.5 % (ref 11.5–15.5)
WBC: 24.7 10*3/uL — ABNORMAL HIGH (ref 4.0–10.5)
WBC: 25.1 10*3/uL — ABNORMAL HIGH (ref 4.0–10.5)
nRBC: 0 % (ref 0.0–0.2)
nRBC: 0 % (ref 0.0–0.2)

## 2020-11-06 LAB — URINE DRUG SCREEN, QUALITATIVE (ARMC ONLY)
Amphetamines, Ur Screen: NOT DETECTED
Barbiturates, Ur Screen: NOT DETECTED
Benzodiazepine, Ur Scrn: NOT DETECTED
Cannabinoid 50 Ng, Ur ~~LOC~~: NOT DETECTED
Cocaine Metabolite,Ur ~~LOC~~: NOT DETECTED
MDMA (Ecstasy)Ur Screen: NOT DETECTED
Methadone Scn, Ur: NOT DETECTED
Opiate, Ur Screen: NOT DETECTED
Phencyclidine (PCP) Ur S: POSITIVE — AB
Tricyclic, Ur Screen: NOT DETECTED

## 2020-11-06 LAB — COMPREHENSIVE METABOLIC PANEL
ALT: 84 U/L — ABNORMAL HIGH (ref 0–44)
AST: 208 U/L — ABNORMAL HIGH (ref 15–41)
Albumin: 3.4 g/dL — ABNORMAL LOW (ref 3.5–5.0)
Alkaline Phosphatase: 100 U/L (ref 38–126)
Anion gap: 12 (ref 5–15)
BUN: 22 mg/dL (ref 8–23)
CO2: 24 mmol/L (ref 22–32)
Calcium: 8.8 mg/dL — ABNORMAL LOW (ref 8.9–10.3)
Chloride: 100 mmol/L (ref 98–111)
Creatinine, Ser: 1.34 mg/dL — ABNORMAL HIGH (ref 0.44–1.00)
GFR, Estimated: 44 mL/min — ABNORMAL LOW (ref >60)
Glucose, Bld: 116 mg/dL — ABNORMAL HIGH (ref 70–99)
Potassium: 3.2 mmol/L — ABNORMAL LOW (ref 3.5–5.1)
Sodium: 136 mmol/L (ref 135–145)
Total Bilirubin: 1.1 mg/dL (ref 0.3–1.2)
Total Protein: 6.1 g/dL — ABNORMAL LOW (ref 6.5–8.1)

## 2020-11-06 LAB — AMMONIA: Ammonia: <9 umol/L — ABNORMAL LOW (ref 9–35)

## 2020-11-06 LAB — HIV ANTIBODY (ROUTINE TESTING W REFLEX): HIV Screen 4th Generation wRfx: NONREACTIVE

## 2020-11-06 LAB — FOLATE: Folate: 9.3 ng/mL (ref >5.9)

## 2020-11-06 LAB — POC SARS CORONAVIRUS 2 AG -  ED: SARS Coronavirus 2 Ag: NEGATIVE

## 2020-11-06 LAB — MAGNESIUM: Magnesium: 1.7 mg/dL (ref 1.7–2.4)

## 2020-11-06 LAB — HEPATITIS PANEL, ACUTE
HCV Ab: NONREACTIVE
Hep A IgM: NONREACTIVE
Hep B C IgM: NONREACTIVE
Hepatitis B Surface Ag: NONREACTIVE

## 2020-11-06 LAB — TSH: TSH: 0.315 u[IU]/mL — ABNORMAL LOW (ref 0.350–4.500)

## 2020-11-06 LAB — PROTIME-INR
INR: 1.1 (ref 0.8–1.2)
Prothrombin Time: 13.7 seconds (ref 11.4–15.2)

## 2020-11-06 LAB — LACTIC ACID, PLASMA: Lactic Acid, Venous: 2.3 mmol/L (ref 0.5–1.9)

## 2020-11-06 LAB — PHOSPHORUS: Phosphorus: 2.7 mg/dL (ref 2.5–4.6)

## 2020-11-06 LAB — VITAMIN B12: Vitamin B-12: 404 pg/mL (ref 180–914)

## 2020-11-06 MED ORDER — LORAZEPAM 2 MG/ML IJ SOLN
0.0000 mg | Freq: Four times a day (QID) | INTRAMUSCULAR | Status: DC
  Administered 2020-11-06: 2 mg via INTRAVENOUS
  Administered 2020-11-06 (×2): 1 mg via INTRAVENOUS
  Administered 2020-11-07 (×2): 2 mg via INTRAVENOUS
  Filled 2020-11-06 (×5): qty 1

## 2020-11-06 MED ORDER — ADULT MULTIVITAMIN W/MINERALS CH
1.0000 | ORAL_TABLET | Freq: Every day | ORAL | Status: DC
  Administered 2020-11-07 – 2020-11-08 (×2): 1 via ORAL
  Filled 2020-11-06 (×3): qty 1

## 2020-11-06 MED ORDER — SODIUM CHLORIDE 0.9 % IV SOLN
2.0000 g | INTRAVENOUS | Status: DC
  Administered 2020-11-06 – 2020-11-07 (×2): 2 g via INTRAVENOUS
  Filled 2020-11-06 (×2): qty 20
  Filled 2020-11-06: qty 2

## 2020-11-06 MED ORDER — ONDANSETRON HCL 4 MG PO TABS: 4.0000 mg | ORAL_TABLET | Freq: Four times a day (QID) | ORAL | Status: DC | PRN

## 2020-11-06 MED ORDER — POTASSIUM CHLORIDE 20 MEQ PO PACK
40.0000 meq | PACK | Freq: Two times a day (BID) | ORAL | Status: AC
  Administered 2020-11-06 (×2): 40 meq via ORAL
  Filled 2020-11-06 (×2): qty 2

## 2020-11-06 MED ORDER — LORAZEPAM 2 MG/ML IJ SOLN: 1.0000 mg | INTRAMUSCULAR | Status: DC | PRN

## 2020-11-06 MED ORDER — LORAZEPAM 1 MG PO TABS: 1.0000 mg | ORAL_TABLET | ORAL | Status: DC | PRN

## 2020-11-06 MED ORDER — THIAMINE HCL 100 MG PO TABS: 100.0000 mg | ORAL_TABLET | Freq: Every day | ORAL | Status: DC

## 2020-11-06 MED ORDER — ONDANSETRON HCL 4 MG/2ML IJ SOLN: 4.0000 mg | Freq: Four times a day (QID) | INTRAMUSCULAR | Status: DC | PRN

## 2020-11-06 MED ORDER — ENOXAPARIN SODIUM 40 MG/0.4ML ~~LOC~~ SOLN
40.0000 mg | SUBCUTANEOUS | Status: DC
  Administered 2020-11-07 – 2020-11-08 (×2): 40 mg via SUBCUTANEOUS
  Filled 2020-11-06 (×3): qty 0.4

## 2020-11-06 MED ORDER — ACETAMINOPHEN 325 MG PO TABS
650.0000 mg | ORAL_TABLET | ORAL | Status: DC | PRN
  Administered 2020-11-06: 650 mg via ORAL
  Filled 2020-11-06: qty 2

## 2020-11-06 MED ORDER — SODIUM CHLORIDE 0.9 % IV SOLN
75.0000 mL/h | INTRAVENOUS | Status: DC
  Administered 2020-11-06 (×2): 75 mL/h via INTRAVENOUS

## 2020-11-06 MED ORDER — SODIUM CHLORIDE 0.9 % IV SOLN
500.0000 mg | INTRAVENOUS | Status: DC
  Administered 2020-11-06 – 2020-11-08 (×3): 500 mg via INTRAVENOUS
  Filled 2020-11-06 (×4): qty 500

## 2020-11-06 MED ORDER — THIAMINE HCL 100 MG/ML IJ SOLN: 100.0000 mg | Freq: Every day | INTRAMUSCULAR | Status: DC

## 2020-11-06 MED ORDER — LORAZEPAM 2 MG/ML IJ SOLN: 0.0000 mg | Freq: Two times a day (BID) | INTRAMUSCULAR | Status: DC

## 2020-11-06 MED ORDER — FOLIC ACID 1 MG PO TABS
1.0000 mg | ORAL_TABLET | Freq: Every day | ORAL | Status: DC
  Administered 2020-11-07 – 2020-11-09 (×3): 1 mg via ORAL
  Filled 2020-11-06 (×3): qty 1

## 2020-11-06 MED ORDER — LORAZEPAM 2 MG/ML IJ SOLN
1.0000 mg | INTRAMUSCULAR | Status: DC | PRN
  Administered 2020-11-06: 1 mg via INTRAVENOUS

## 2020-11-06 MED ORDER — ACETAMINOPHEN 650 MG RE SUPP: 650.0000 mg | RECTAL | Status: DC | PRN

## 2020-11-06 MED ORDER — SODIUM CHLORIDE 0.9 % IV SOLN: 75.0000 mL/h | INTRAVENOUS | Status: DC

## 2020-11-06 NOTE — ED Notes (Signed)
2 more SST sent to lab

## 2020-11-06 NOTE — ED Notes (Signed)
Unable to obtain accurate weight and height at this time due to pt status and inability to stand safely at this time. Will obtain weight when pt is placed in hospital bed.

## 2020-11-06 NOTE — Procedures (Signed)
Patient Name: Caroline Serrano  MRN: [id number]  Epilepsy Attending: Charlsie Quest  Referring Physician/Provider: Dr. Lindajo Royal Date: 11/06/2020 Duration: 21.55 minutes  Patient history: 65 year old female with history of alcohol abuse who presented with witnessed seizure.  EEG to evaluate for seizures.  Level of alertness: Awake  AEDs during EEG study: Ativan  Technical aspects: This EEG study was done with scalp electrodes positioned according to the 10-20 International system of electrode placement. Electrical activity was acquired at a sampling rate of 500Hz  and reviewed with a high frequency filter of 70Hz  and a low frequency filter of 1Hz . EEG data were recorded continuously and digitally stored.   Description: No clear posterior dominant rhythm was seen.  There is an excessive amount of 15 to 18 Hz beta activity  distributed symmetrically and diffusely. Hyperventilation and photic stimulation were not performed.     ABNORMALITY -Excessive beta, generalized  IMPRESSION: This study is within normal limits. No seizures or epileptiform discharges were seen throughout the recording. The excessive beta activity seen in the background is most likely due to the effect of benzodiazepine and is a benign EEG pattern.   Donyea Beverlin 

## 2020-11-06 NOTE — H&P (Signed)
History and Physical    Caroline Serrano CBJ:[id number] DOB: [birth year] DOA: 11/05/2020  PCP: Freddy Finner, NP   Patient coming from: Home  I have personally briefly reviewed patient's old medical records in [place]  Chief Complaint: Altered mental status  HPI: Caroline Serrano is a 65 y.o. adult with medical history significant for hypertension, anxiety and alcohol abuse who was brought in after her roommate found her on the floor at home with jerking movements.    Patient is unable to contribute to history due to altered mental status.  Most of the history is taken from ED provider.  Patient usually drinks up to 24 beers a day but had less than her usual.  She has no prior history of seizures. ED Course: On arrival, blood pressure 190/154, pulse 109 respirations 25, O2 sat 100% on room air.  Blood work significant for leukocytosis of 21,000 with lactic acid of 3.7.  CMP showed creatinine of 1.6, with elevated LFTs, AST 144, ALT 73, alk phos 128, total bili 1.4.  Potassium 3.2.  Alcohol level less than 10, acetaminophen and salicylate levels undetectable.  Urinalysis pending Imaging: Chest x-ray showed no active disease.  CT head showed no acute intracranial abnormality.  Patient received empiric antibiotics in the ER for possible infection.  Laceration of scalp repaired in the ER.  Hospitalist consulted for admission.  Review of Systems: Unable to obtain due to altered mental status   Past Medical History:  Diagnosis Date  . Anxiety   . Depression     Past Surgical History:  Procedure Laterality Date  . GASTRIC BYPASS       reports that she has never smoked. She has never used smokeless tobacco. She reports current alcohol use. She reports that she does not use drugs.  Allergies  Allergen Reactions  . Aspirin Other (See Comments)    Gastric Bypass  . Ibuprofen Other (See Comments)    Gastric bypass    Family History  Problem Relation Age of Onset   . Hypertension Mother   . Stroke Mother   . Diabetes Mother   . Hypertension Father   . Diabetes Paternal Grandmother       Prior to Admission medications   Medication Sig Start Date End Date Taking? Authorizing Provider  amLODipine (NORVASC) 10 MG tablet Take 10 mg by mouth daily. 10/20/16   Freddy Finner, NP  aspirin EC 81 MG tablet Take 81 mg by mouth daily.    [provider]  Biotin 1 MG CAPS Take 5 mg by mouth 2 (two) times daily.    [provider]  buPROPion (WELLBUTRIN XL) 300 MG 24 hr tablet Take 300 mg by mouth daily.  06/08/16   Freddy Finner, NP  chlorthalidone (HYGROTON) 25 MG tablet Take 50 mg by mouth daily.    [provider]  cyanocobalamin 1000 MCG tablet Take 5,000 mcg by mouth daily.    [provider]  hydrOXYzine (ATARAX/VISTARIL) 10 MG tablet Take 10 mg by mouth 3 (three) times daily as needed.    [provider]  lisinopril (PRINIVIL,ZESTRIL) 40 MG tablet Take 40 mg by mouth daily. 01/01/16   Freddy Finner, NP  metoprolol succinate (TOPROL-XL) 25 MG 24 hr tablet Take 25 mg by mouth daily.    [provider]  testosterone cypionate (DEPO-TESTOSTERONE) 200 MG/ML injection Inject 6 mg as directed once a week.  09/16/15   [provider]  traZODone (DESYREL) 100 MG tablet Take 100 mg  by mouth at bedtime as needed for sleep.    [provider]  venlafaxine XR (EFFEXOR-XR) 75 MG 24 hr capsule Take 225 mg by mouth daily with breakfast. 01/02/16   Freddy Finner, NP    Physical Exam: Vitals:   11/05/20 2043 11/05/20 2044 11/05/20 2200 11/05/20 2300  BP: (!) 155/84  (!) 142/84 (!) 152/84  Pulse: 97 (!) 109 96 99  Resp: (!) 24  (!) 21 (!) 22  SpO2: 100%  100% 100%     Vitals:   11/05/20 2043 11/05/20 2044 11/05/20 2200 11/05/20 2300  BP: (!) 155/84  (!) 142/84 (!) 152/84  Pulse: 97 (!) 109 96 99  Resp: (!) 24  (!) 21 (!) 22  SpO2: 100%  100% 100%      Constitutional:  Somnolent will  arouse to shaking and answer in 1 syllable answers but readily falls back asleep.  Not in apparent distress HEENT:      Head:  2 cm laceration right anterior forehead with well of rounded borders.         Eyes: PERLA, EOMI, Conjunctivae are normal. Sclera is non-icteric.       Mouth/Throat: Mucous membranes are moist.       Neck: Supple with no signs of meningismus. Cardiovascular: Regular rate and rhythm. No murmurs, gallops, or rubs. 2+ symmetrical distal pulses are present . No JVD. No LE edema Respiratory: Respiratory effort normal .Lungs sounds clear bilaterally. No wheezes, crackles, or rhonchi.  Gastrointestinal: Soft, non tender, and non distended with positive bowel sounds.  Genitourinary: No CVA tenderness. Musculoskeletal: Nontender with normal range of motion in all extremities. No cyanosis, or erythema of extremities. Neurologic: Patient very somnolent unable to fully participate face is symmetric. Moving all extremities. No gross focal neurologic deficits . Skin: Skin is warm, dry.  No rash or ulcers Psychiatric: Very somnolent   Labs on Admission: I have personally reviewed following labs and imaging studies  CBC: Recent Labs  Lab 11/05/20 2106  WBC 21.3*  NEUTROABS 19.1*  HGB 12.6  HCT 36.1  MCV 81.5  PLT 585   Basic Metabolic Panel: Recent Labs  Lab 11/05/20 2106  NA 136  K 3.2*  CL 99  CO2 22  GLUCOSE 95  BUN 24*  CREATININE 1.60*  CALCIUM 8.6*   GFR: CrCl cannot be calculated (Unknown ideal weight.). Liver Function Tests: Recent Labs  Lab 11/05/20 2106  AST 144*  ALT 73*  ALKPHOS 128*  BILITOT 1.4*  PROT 6.4*  ALBUMIN 3.8   Recent Labs  Lab 11/05/20 2106  LIPASE 17   No results for input(s): AMMONIA in the last 168 hours. Coagulation Profile: No results for input(s): INR, PROTIME in the last 168 hours. Cardiac Enzymes: No results for input(s): CKTOTAL, CKMB, CKMBINDEX, TROPONINI in the last 168 hours. BNP (last 3 results) No results  for input(s): PROBNP in the last 8760 hours. HbA1C: No results for input(s): HGBA1C in the last 72 hours. CBG: No results for input(s): GLUCAP in the last 168 hours. Lipid Profile: No results for input(s): CHOL, HDL, LDLCALC, TRIG, CHOLHDL, LDLDIRECT in the last 72 hours. Thyroid Function Tests: No results for input(s): TSH, T4TOTAL, FREET4, T3FREE, THYROIDAB in the last 72 hours. Anemia Panel: No results for input(s): VITAMINB12, FOLATE, FERRITIN, TIBC, IRON, RETICCTPCT in the last 72 hours. Urine analysis: No results found for: COLORURINE, APPEARANCEUR, LABSPEC, PHURINE, GLUCOSEU, HGBUR, BILIRUBINUR, KETONESUR, PROTEINUR, UROBILINOGEN, NITRITE, LEUKOCYTESUR  Radiological Exams on Admission: DG Chest 1 View  Result  Date: 11/05/2020 CLINICAL DATA:  Altered mental status, intoxication EXAM: CHEST  1 VIEW COMPARISON:  None. FINDINGS: Normal heart size. Normal mediastinal contour. No pneumothorax. No pleural effusion. Lungs appear clear, with no acute consolidative airspace disease and no pulmonary edema. IMPRESSION: No active disease. Electronically Signed   By: Ilona Sorrel M.D.   On: 11/05/2020 20:07   CT HEAD WO CONTRAST  Result Date: 11/05/2020 CLINICAL DATA:  Nonverbal jerking motion EXAM: CT HEAD WITHOUT CONTRAST TECHNIQUE: Contiguous axial images were obtained from the base of the skull through the vertex without intravenous contrast. COMPARISON:  None. FINDINGS: Brain: No acute territorial infarction, hemorrhage or intracranial mass. Mild to moderate atrophy. Nonenlarged ventricles. Vascular: No hyperdense vessels.  Carotid vascular calcification Skull: Normal. Negative for fracture or focal lesion. Sinuses/Orbits: No acute finding. Other: Mild right forehead scalp edema. Incomplete union posterior arch C1. Hypoplastic appearing right mandibular head. IMPRESSION: 1. No CT evidence for acute intracranial abnormality. 2. Atrophy. Electronically Signed   By: Donavan Foil M.D.   On: 11/05/2020  20:36     Assessment/Plan 65 year old female with history of hypertension, anxiety and alcohol abuse who was brought in after her roommate found her on the floor at home with jerking movements.    Seizure, suspect alcohol withdrawal seizure  unspecified complication -Patient with jerking movements and no prior history of seizure with serum alcohol level less than 10 -CT head with no acute intracranial abnormality -Fall seizure and aspiration precautions -Ativan as needed seizure -EEG in the morning -Neurology consult in the a.m.    Sepsis ([place]) -Patient with tachycardia, tachypnea, altered mental status and acute kidney injury with leukocytosis of 21,000 and lactic acid 3.7 which could possibly be explained by seizure but some suspicion for infection -Given altered mental status will treat as aspiration pneumonia in spite of negative chest x-ray -Follow-up urinalysis to assess for UTI -Sepsis fluids -IV Rocephin and azithromycin -Aspiration precautions -Follow blood cultures    Acute metabolic encephalopathy -Secondary to seizure, postictal state versus sepsis or both -Aspiration and fall precautions  Acute kidney injury -Creatinine 1.6.  Suspect prerenal -IV hydration and monitor    Alcohol use disorder, moderate, dependence (HCC) -CIWA withdrawal protocol    Elevated liver enzymes, likely alcoholic liver disease -Secondary to alcohol use -Continue to monitor  Scalp laceration -Sutured in the emergency room.  Continue wound care    HTN (hypertension) -Labetalol as needed systolic over 003  Depression and anxiety -Continue venlafaxine pending med rec and when more awake    DVT prophylaxis: Lovenox  Code Status: full code  Family Communication:  none  Disposition Plan: Back to previous home environment Consults called: Neurology Status:At the time of admission, it appears that the appropriate admission status for this patient is INPATIENT. This is judged to be  reasonable and necessary in order to provide the required intensity of service to ensure the patient's safety given the presenting symptoms, physical exam findings, and initial radiographic and laboratory data in the context of their  Comorbid conditions.   Patient requires inpatient status due to high intensity of service, high risk for further deterioration and high frequency of surveillance required.   I certify that at the point of admission it is my clinical judgment that the patient will require inpatient hospital care spanning beyond [place] MD [facility]     11/06/2020, 12:23 AM

## 2020-11-06 NOTE — ED Notes (Signed)
EEG tech arrived to do test. Patient was standing next to bed. Per tech patient was confused, tachycardic and diaphoretic. Patient was assisted back to bed. Vitals stable. Ativan on hold until after EEG per tech request.

## 2020-11-06 NOTE — ED Notes (Signed)
Patient to MRI.

## 2020-11-06 NOTE — Evaluation (Addendum)
Clinical/Bedside Swallow Evaluation Patient Details  Name: Caroline Serrano MRN: [id number] Date of Birth: 1956/01/21  Today's Date: 11/06/2020 Time: SLP Start Time (ACUTE ONLY): 1433 SLP Stop Time (ACUTE ONLY): 1445 SLP Time Calculation (min) (ACUTE ONLY): 12 min  Past Medical History:  Past Medical History:  Diagnosis Date  . Anxiety   . Depression    Past Surgical History:  Past Surgical History:  Procedure Laterality Date  . GASTRIC BYPASS     HPI:  Caroline Serrano is a 41 transgender female to female with past medical history of HTN, depression and current alcohol abuse. Pt was found down by his girlfriend with witnessed jerking movements and brought to the ED for further evaluation. Chest x-ray and head CT negative for any acute abnormalities.   Assessment / Plan / Recommendation Clinical Impression  Pt demonstrates grossly adequate oropharyngeal abilities when consuming regular textures, puree and thin liquids via straw. However pt is missing his lower molars which makes it difficult to masticate harder textures. Given this, he is agreeable to having softer foods while hospitalized. Therefore recommend dysphagia 3 diet with thin liquids via straw, medicine whole with thin liquids, may use straw. No further ST intervention is indicated at this time. SLP Visit Diagnosis: Dysphagia, unspecified (R13.10)    Aspiration Risk  No limitations    Diet Recommendation Dysphagia 2;Thin liquid   Liquid Administration via: Straw Medication Administration: Whole meds with liquid Supervision: Staff to assist with self feeding Compensations: Minimize environmental distractions;Slow rate;Small sips/bites Postural Changes: Seated upright at 90 degrees    Other  Recommendations Oral Care Recommendations: Oral care BID   Follow up Recommendations None        Swallow Study   General Date of Onset: 11/06/20 HPI: Caroline Serrano is a 51 transgender female to female  with past medical history of HTN, depression and current alcohol abuse. Pt was found down by his girlfriend with witnessed jerking movements and brought to the ED for further evaluation. Chest x-ray and head CT negative for any acute abnormalities. Type of Study: Bedside Swallow Evaluation Previous Swallow Assessment: none in chart Diet Prior to this Study: NPO Temperature Spikes Noted: No Respiratory Status: Room air History of Recent Intubation: No Behavior/Cognition: Alert;Cooperative;Pleasant mood Oral Cavity Assessment: Within Functional Limits Oral Care Completed by SLP: Recent completion by staff Oral Cavity - Dentition: Adequate natural dentition;Missing dentition (missing lower molars) Vision: Functional for self-feeding Self-Feeding Abilities: Needs assist Patient Positioning: Upright in bed Baseline Vocal Quality: Normal Volitional Cough: Strong Volitional Swallow: Able to elicit    Oral/Motor/Sensory Function Overall Oral Motor/Sensory Function: Within functional limits   Ice Chips Ice chips: Not tested   Thin Liquid Thin Liquid: Within functional limits Presentation: Straw;Self Fed    Nectar Thick Nectar Thick Liquid: Not tested   Honey Thick Honey Thick Liquid: Not tested   Puree Puree: Within functional limits   Solid     Solid: Within functional limits Presentation: Self Fed     Caroline Serrano B. Caroline Serrano M.S., CCC-SLP, [facility] Speech-Language Pathologist Rehabilitation Services Office [phone]  Caroline Serrano Caroline Serrano 11/06/2020,3:54 PM

## 2020-11-06 NOTE — Progress Notes (Signed)
Following for Code Sepsis  

## 2020-11-06 NOTE — Progress Notes (Signed)
eeg done °

## 2020-11-06 NOTE — Consult Note (Addendum)
Neurology Consult H&P  CC: seizure  History is obtained from: patient  HPI: Caroline Serrano is a 65 y.o. adult right handed PMHx reviewed below, alcohol abuse was found down by roommate with witnessed jerking movements and brought to ED for further evaluation. The patient only remembers jerking movement of her right arm then everything else is blank. She has never had a seizure in the past. Her younger brother has epilepsy.  She states that her last drink was the night PTA and that she mainly consumes beer and binges due to financial constraints.  The patient has a mild cough, but denies intercurrent illness. Denies dizziness, visual/hearing changes, weakness, slurred speech.  ROS: A complete ROS was performed and is negative except as noted in the HPI.  Past Medical History:  Diagnosis Date  . Anxiety   . Depression    Family History  Problem Relation Age of Onset  . Hypertension Mother   . Stroke Mother   . Diabetes Mother   . Hypertension Father   . Diabetes Paternal Grandmother     Social History:  reports that she has never smoked. She has never used smokeless tobacco. She reports current alcohol use. She reports that she does not use drugs.   Prior to Admission medications   Medication Sig Start Date End Date Taking? Authorizing Provider  amLODipine (NORVASC) 10 MG tablet Take 10 mg by mouth daily. 10/20/16  Yes Sandrea Hughs, NP  aspirin EC 81 MG tablet Take 81 mg by mouth daily.   Yes [provider]  Biotin 1 MG CAPS Take 5 mg by mouth 2 (two) times daily.   Yes [provider]  buPROPion (WELLBUTRIN XL) 150 MG 24 hr tablet Take 150 mg by mouth every morning. Take with 300 mg (for a total of 450 mg) 10/01/20  Yes [provider]  buPROPion (WELLBUTRIN XL) 300 MG 24 hr tablet Take 300 mg by mouth daily. (for a total of 450 mg) 06/08/16  Yes Sandrea Hughs, NP  chlorthalidone (HYGROTON) 25 MG tablet Take 50 mg by mouth daily.   Yes  [provider]  cyanocobalamin (,VITAMIN B-12,) 1000 MCG/ML injection Inject 1 ml intramuscularly once a month 07/10/20  Yes [provider]  ferrous sulfate 325 (65 FE) MG EC tablet Take 1 tablet by mouth daily. 07/10/20  Yes [provider]  hydrOXYzine (ATARAX/VISTARIL) 10 MG tablet Take 10 mg by mouth 3 (three) times daily as needed.   Yes [provider]  lisinopril (PRINIVIL,ZESTRIL) 40 MG tablet Take 40 mg by mouth daily. 01/01/16  Yes Sandrea Hughs, NP  metoprolol succinate (TOPROL-XL) 25 MG 24 hr tablet Take 25 mg by mouth daily.   Yes [provider]  testosterone cypionate (DEPOTESTOSTERONE CYPIONATE) 200 MG/ML injection Inject 6 mg as directed once a week.  09/16/15  Yes [provider]  traZODone (DESYREL) 100 MG tablet Take 100 mg by mouth at bedtime as needed for sleep.   Yes [provider]  cyanocobalamin 1000 MCG tablet Take 5,000 mcg by mouth daily. Patient not taking: No sig reported    [provider]  venlafaxine XR (EFFEXOR-XR) 75 MG 24 hr capsule Take 225 mg by mouth daily with breakfast. Patient not taking: No sig reported 01/02/16   Sandrea Hughs, NP   Exam: Current vital signs: BP (!) 162/92   Pulse (!) 101   Temp 100.1 F (37.8 C) (Rectal)   Resp (!) 21   SpO2 94%   Physical Exam  Constitutional: Appears well-developed and well-nourished.  Psych: Affect appropriate to situation Eyes: No scleral injection HENT: No OP obstrucion Head: Normocephalic.  Cardiovascular: Normal rate and regular rhythm.  Respiratory: Effort normal and breath sounds normal to anterior ascultation GI: Soft.  No distension. There is no tenderness.  Skin: WDI  Neuro: Mental Status: Patient is awake, alert, oriented to person, place, month, year, and situation. Patient is able to give a clear and coherent history. No signs of aphasia or neglect. Cranial Nerves: II: Visual Fields are full. Pupils are equal, round,  and reactive to light. III,IV, VI: EOMI without ptosis or diploplia.  V: Facial sensation is symmetric to temperature VII: Facial movement is symmetric.  VIII: hearing is intact to voice X: Uvula elevates symmetrically XI: Shoulder shrug is symmetric. XII: tongue is midline without atrophy or fasciculations.  Motor: Tone is normal. Bulk is normal. 5/5 strength was present in all four extremities. Sensory: Sensation is symmetric to light touch and temperature in the arms and legs. Deep Tendon Reflexes: 2+ and symmetric in the biceps and patellae. Plantars: Toes are downgoing bilaterally. Cerebellar: FNF and HKS are intact bilaterally.  I have reviewed labs in epic and the pertinent results are:   Ref. Range 11/06/2020 08:36  Albumin Latest Ref Range: 3.5 - 5.0 g/dL 3.4 (L)  AST Latest Ref Range: 15 - 41 U/L 208 (H)  ALT Latest Ref Range: 0 - 44 U/L 84 (H)  Total Protein Latest Ref Range: 6.5 - 8.1 g/dL 6.1 (L)    Ref. Range 11/06/2020 05:59  Lactic Acid, Venous Latest Ref Range: 0.5 - 1.9 mmol/L 2.3 (HH)    Ref. Range 11/06/2020 08:36  WBC Latest Ref Range: 4.0 - 10.5 K/uL 25.1 (H)  RBC Latest Ref Range: 3.87 - 5.11 MIL/uL 4.17  Hemoglobin Latest Ref Range: 12.0 - 15.0 g/dL 19.4 (L)  HCT Latest Ref Range: 36.0 - 46.0 % 33.4 (L)    Ref. Range 11/05/2020 21:06  Alcohol, Ethyl (B) Latest Ref Range: <10 mg/dL <17  Salicylate Lvl Latest Ref Range: 7.0 - 30.0 mg/dL <4.0 (L)    Ref. Range 11/05/2020 19:52  Phencyclidine (PCP)  Latest Ref Range: NONE DETECTED  POSITIVE (A)    I have reviewed the images obtained: NCT head showed no acute ischemic changes, hemorrhage, mass.  Assessment: Janiyah Priscille Shadduck is a 65 y.o. adult PMHx alcohol abuse last drink the night PTA with witnessed seizure. She has never had a seizure in the past and therefore seizure most likely was provoked (withdrawal, metabolic, infections, stroke). She states that she is back to baseline and will need further  imaging studies and routine EEG as part of seizure workup. No indication to start antiseizure medication at this time.   Labs showed phencyclidine (+) and patient denies illicits and in exploring this with her, she was clear about taking PCP. False positive urine screens for PCP are common with tramadol, dextromethorphan, alprazolam, clonazepam, and carvedilol and may also occur with diphenhydramine to name a few. However, PCP may also cause seizures.   Impression:  First time seizure - Likely provoked will need further workup. Alcohol abuse.  Plan: - MRI brain without contrast and if shows stroke will need further workup. - Routine EEG. - Continue treatment of alcohol withdrawal/nutrional supplementation. - Agree with sepsis/metabolic workup. - Will continue to follow.   Electronically signed by: Dr. Marisue Humble Pager: [phone] 11/06/2020, 9:03 AM

## 2020-11-06 NOTE — Progress Notes (Signed)
Notified bedside nurse of need to draw repeat lactic acid. 

## 2020-11-06 NOTE — Progress Notes (Signed)
PROGRESS NOTE    Caroline Serrano  WVP:[id number] DOB: 1956/03/11 DOA: 11/05/2020 PCP: Freddy Finner, NP   Brief Narrative:  Caroline Serrano is a 65 y.o. adult with medical history significant for hypertension, anxiety and alcohol abuse who was brought in after her roommate found her on the floor at home with jerking movements.    Patient is unable to contribute to history due to altered mental status.  Most of the history is taken from ED provider.  Patient usually drinks up to 24 beers a day but had less than her usual.  She has no prior history of seizures. ED Course: On arrival, blood pressure 190/154, pulse 109 respirations 25, O2 sat 100% on room air.  Blood work significant for leukocytosis of 21,000 with lactic acid of 3.7.  CMP showed creatinine of 1.6, with elevated LFTs, AST 144, ALT 73, alk phos 128, total bili 1.4.  Potassium 3.2.  Alcohol level less than 10, acetaminophen and salicylate levels undetectable.  Urinalysis pending Imaging: Chest x-ray showed no active disease.  CT head showed no acute intracranial abnormality. Patient received empiric antibiotics in the ER for possible infection.  Laceration of scalp repaired in the ER.  Hospitalist consulted for admission.   Assessment & Plan:   Sepsis: -Unknown underlying etiology?  Patient presented with tachycardia, tachypnea, altered mental status, AKI with leukocytosis of 21,000 and lactic acid of 3.7. -UA negative for infection.  Patient started on IV fluids, Rocephin and azithromycin in ED. -Chest x-ray negative for pneumonia, right upper quadrant ultrasound negative for acute cholecystitis.  Blood culture is pending. -Continue IV antibiotics and IV fluids.  Follow culture result.  Trend lactic acid.  Seizure-like activity?: -Patient presented with jerking movements with no prior history of seizure.  Serum alcohol level: Less than 10.  CT head negative for acute findings. -Could be in the setting of  phencyclidine intoxication?  UDS is positive for phencyclidine -Ativan as needed for seizure. -Await neurology's recommendation.  EEG is pending. -On aspiration/seizure precautions. -We will keep her n.p.o. for now.  Acute metabolic encephalopathy: -In the setting of seizure versus post ictal state versus PCP intoxication Versus underlying infection or both? -CT head negative for acute findings. -Ethanol level: WNL, ammonia level: Less than 9, acetaminophen level and salicylate level: WNL.  COVID-19 negative.  Check B12, folate, TSH and RPR. -Continue with aspiration/fall precautions.  AKI: -Likely in the setting of underlying infection.  Kidney function is improving.  -Continue IV hydration.  Avoid nephrotoxic medication.  Repeat BMP tomorrow a.m.  Elevated liver enzymes: -In the setting of alcohol abuse.  Right upper quadrant ultrasound shows mild liver surface contour nodularity raising possibility of cirrhosis.  Ammonia level: WNL.  Check PT/INR and acute hepatitis panel. -Repeat CMP tomorrow.  Alcohol use disorder: -Serum ethanol level: Less than 10. -Continue with CIWA protocol.  Hypokalemia: Potassium 3.2.  Replenished.  Magnesium level: WNL.  Repeat BMP tomorrow.  Scalp laceration: -Sutured in ED.  Hypertension: Blood pressure is elevated.  Continue labetalol as needed for blood pressure more than 160/100  Depression/anxiety: Continue to hold p.o. meds for now until passes bedside swallow evaluation.  DVT prophylaxis: Lovenox Code Status: Full code Family Communication:  None present at bedside.  Plan of care discussed with patient in length and she verbalized understanding and agreed with it. Disposition Plan: To be determined-pending work-up  Consultants:   Neurology  Procedures:   CT head  Antimicrobials:   Rocephin  Azithromycin  Status is: Inpatient  Dispo: The patient is from: Home              Anticipated d/c is to: Home              Anticipated  d/c date is: 3 days              Patient currently is not medically stable to d/c.   Subjective: Patient seen and examined in ED.  Resting comfortably on the bed.  Appears weak and lethargic, tells me that she is doing better this morning-denies any complaints.  Objective: Vitals:   11/06/20 0600 11/06/20 0628 11/06/20 1016 11/06/20 1151  BP: (!) 162/92 (!) 162/92 (!) 147/79 (!) 147/85  Pulse: (!) 101 (!) 101 (!) 107 (!) 116  Resp: (!) 21  13   Temp: 100.1 F (37.8 C)     TempSrc: Rectal     SpO2: 94%  99%     Intake/Output Summary (Last 24 hours) at 11/06/2020 1245 Last data filed at 11/06/2020 0439 Gross per 24 hour  Intake 251.78 ml  Output -  Net 251.78 ml   There were no vitals filed for this visit.  Examination:  General exam: Appears calm and comfortable, on room air, appears weak and lethargic, answers appropriately and following commands. Respiratory system: Clear to auscultation. Respiratory effort normal. Cardiovascular system: S1 & S2 heard, RRR. No JVD, murmurs, rubs, gallops or clicks. No pedal edema. Gastrointestinal system: Abdomen is nondistended, soft and nontender. No organomegaly or masses felt. Normal bowel sounds heard. Central nervous system: Alert and oriented. No focal neurological deficits. Extremities: Symmetric 5 x 5 power. Skin: No rashes, lesions or ulcers Psychiatry: Judgement and insight appear normal. Mood & affect appropriate.    Data Reviewed: I have personally reviewed following labs and imaging studies  CBC: Recent Labs  Lab 11/05/20 2106 11/06/20 0559 11/06/20 0836  WBC 21.3* 24.7* 25.1*  NEUTROABS 19.1*  --   --   HGB 12.6 11.8* 11.8*  HCT 36.1 33.4* 33.4*  MCV 81.5 80.7 80.1  PLT 297 279 619   Basic Metabolic Panel: Recent Labs  Lab 11/05/20 2106 11/06/20 0559 11/06/20 0836  NA 136  --  136  K 3.2*  --  3.2*  CL 99  --  100  CO2 22  --  24  GLUCOSE 95  --  116*  BUN 24*  --  22  CREATININE 1.60*  --  1.34*   CALCIUM 8.6*  --  8.8*  MG  --  1.7  --   PHOS  --  2.7  --    GFR: CrCl cannot be calculated (Unknown ideal weight.). Liver Function Tests: Recent Labs  Lab 11/05/20 2106 11/06/20 0836  AST 144* 208*  ALT 73* 84*  ALKPHOS 128* 100  BILITOT 1.4* 1.1  PROT 6.4* 6.1*  ALBUMIN 3.8 3.4*   Recent Labs  Lab 11/05/20 2106  LIPASE 17   Recent Labs  Lab 11/06/20 0836  AMMONIA <9*   Coagulation Profile: Recent Labs  Lab 11/06/20 0836  INR 1.1   Cardiac Enzymes: No results for input(s): CKTOTAL, CKMB, CKMBINDEX, TROPONINI in the last 168 hours. BNP (last 3 results) No results for input(s): PROBNP in the last 8760 hours. HbA1C: No results for input(s): HGBA1C in the last 72 hours. CBG: No results for input(s): GLUCAP in the last 168 hours. Lipid Profile: No results for input(s): CHOL, HDL, LDLCALC, TRIG, CHOLHDL, LDLDIRECT in the last 72 hours. Thyroid Function Tests: No results for  input(s): TSH, T4TOTAL, FREET4, T3FREE, THYROIDAB in the last 72 hours. Anemia Panel: No results for input(s): VITAMINB12, FOLATE, FERRITIN, TIBC, IRON, RETICCTPCT in the last 72 hours. Sepsis Labs: Recent Labs  Lab 11/05/20 2158 11/06/20 0559  LATICACIDVEN 3.7* 2.3*    Recent Results (from the past 240 hour(s))  Blood culture (single)     Status: None (Preliminary result)   Collection Time: 11/05/20 10:04 PM   Specimen: BLOOD  Result Value Ref Range Status   Specimen Description BLOOD LEFT ANTECUBITAL  Final   Special Requests   Final    BOTTLES DRAWN AEROBIC AND ANAEROBIC Blood Culture adequate volume   Culture   Final    NO GROWTH < 12 HOURS Performed at [facility], [address] [place], [place] [address]    Report Status PENDING  Incomplete      Radiology Studies: DG Chest 1 View  Result Date: 11/05/2020 CLINICAL DATA:  Altered mental status, intoxication EXAM: CHEST  1 VIEW COMPARISON:  None. FINDINGS: Normal heart size. Normal mediastinal contour. No  pneumothorax. No pleural effusion. Lungs appear clear, with no acute consolidative airspace disease and no pulmonary edema. IMPRESSION: No active disease. Electronically Signed   By: Ilona Sorrel M.D.   On: 11/05/2020 20:07   CT HEAD WO CONTRAST  Result Date: 11/05/2020 CLINICAL DATA:  Nonverbal jerking motion EXAM: CT HEAD WITHOUT CONTRAST TECHNIQUE: Contiguous axial images were obtained from the base of the skull through the vertex without intravenous contrast. COMPARISON:  None. FINDINGS: Brain: No acute territorial infarction, hemorrhage or intracranial mass. Mild to moderate atrophy. Nonenlarged ventricles. Vascular: No hyperdense vessels.  Carotid vascular calcification Skull: Normal. Negative for fracture or focal lesion. Sinuses/Orbits: No acute finding. Other: Mild right forehead scalp edema. Incomplete union posterior arch C1. Hypoplastic appearing right mandibular head. IMPRESSION: 1. No CT evidence for acute intracranial abnormality. 2. Atrophy. Electronically Signed   By: Donavan Foil M.D.   On: 11/05/2020 20:36   US Abdomen Limited RUQ (LIVER/GB)  Result Date: 11/06/2020 CLINICAL DATA:  Elevated liver enzymes EXAM: ULTRASOUND ABDOMEN LIMITED RIGHT UPPER QUADRANT COMPARISON:  None. FINDINGS: Gallbladder: Cholecystectomy Common bile duct: Diameter: 4 mm, normal Liver: Few small cysts measuring up to 1.1 cm. Possible surface contour nodularity. Minimally increased parenchymal echogenicity. Portal vein is patent on color Doppler imaging with normal direction of blood flow towards the liver. Other: None. IMPRESSION: Mild liver surface contour nodularity raising possibility of cirrhosis. Minimally increased liver echogenicity may reflect steatosis or chronic liver disease. Electronically Signed   By: Macy Mis M.D.   On: 11/06/2020 09:21    Scheduled Meds: . enoxaparin (LOVENOX) injection  40 mg Subcutaneous Q24H  . folic acid  1 mg Oral Daily  . LORazepam  0-4 mg Intravenous Q6H   Or  .  LORazepam  0-4 mg Oral Q6H  . [START ON 11/08/2020] LORazepam  0-4 mg Intravenous Q12H   Or  . [START ON 11/08/2020] LORazepam  0-4 mg Oral Q12H  . LORazepam  0-4 mg Intravenous Q6H   Followed by  . [START ON 11/08/2020] LORazepam  0-4 mg Intravenous Q12H  . multivitamin with minerals  1 tablet Oral Daily  . thiamine  100 mg Oral Daily   Or  . thiamine  100 mg Intravenous Daily   Continuous Infusions: . sodium chloride    . sodium chloride 75 mL/hr (11/06/20 0158)  . azithromycin Stopped (11/06/20 0313)  . cefTRIAXone (ROCEPHIN)  IV    . lactated ringers  LOS: 0 days   Time spent: 40 minutes.   Mckinley Jewel, MD [facility]  If 7PM-7AM, please contact night-coverage [email or web address] 11/06/2020, 12:45 PM

## 2020-11-06 NOTE — Progress Notes (Signed)
OT Cancellation Note  Patient Details Name: Caroline Serrano MRN: [id number] DOB: 10-09-1956   Cancelled Treatment:    Reason Eval/Treat Not Completed: Active bedrest order. OT order received and chart reviewed. Per RN, pt also receiving ativan intermittently for tremors and pt just generally not feeling well. OT to follow up tomorrow if pt is no longer on active bed rest orders and able to actively participate.   Jackquline Denmark, MS, OTR/L , CBIS ascom [phone]  11/06/20, 1:32 PM  11/06/2020, 1:31 PM

## 2020-11-06 NOTE — ED Notes (Signed)
SST, dark green on ice, light green, lavender, blue top lab tubes drawn and sent to lab

## 2020-11-07 DIAGNOSIS — F332 Major depressive disorder, recurrent severe without psychotic features: Secondary | ICD-10-CM

## 2020-11-07 DIAGNOSIS — G9341 Metabolic encephalopathy: Secondary | ICD-10-CM

## 2020-11-07 DIAGNOSIS — F1020 Alcohol dependence, uncomplicated: Secondary | ICD-10-CM

## 2020-11-07 DIAGNOSIS — R569 Unspecified convulsions: Secondary | ICD-10-CM | POA: Diagnosis not present

## 2020-11-07 DIAGNOSIS — F10239 Alcohol dependence with withdrawal, unspecified: Secondary | ICD-10-CM | POA: Diagnosis not present

## 2020-11-07 LAB — COMPREHENSIVE METABOLIC PANEL
ALT: 94 U/L — ABNORMAL HIGH (ref 0–44)
AST: 212 U/L — ABNORMAL HIGH (ref 15–41)
Albumin: 3.3 g/dL — ABNORMAL LOW (ref 3.5–5.0)
Alkaline Phosphatase: 90 U/L (ref 38–126)
Anion gap: 12 (ref 5–15)
BUN: 13 mg/dL (ref 8–23)
CO2: 28 mmol/L (ref 22–32)
Calcium: 8.9 mg/dL (ref 8.9–10.3)
Chloride: 102 mmol/L (ref 98–111)
Creatinine, Ser: 0.95 mg/dL (ref 0.44–1.00)
GFR, Estimated: >60 mL/min (ref >60)
Glucose, Bld: 86 mg/dL (ref 70–99)
Potassium: 3.1 mmol/L — ABNORMAL LOW (ref 3.5–5.1)
Sodium: 142 mmol/L (ref 135–145)
Total Bilirubin: 1 mg/dL (ref 0.3–1.2)
Total Protein: 6.1 g/dL — ABNORMAL LOW (ref 6.5–8.1)

## 2020-11-07 LAB — MAGNESIUM: Magnesium: 1.8 mg/dL (ref 1.7–2.4)

## 2020-11-07 LAB — CBC
HCT: 36.7 % (ref 36.0–46.0)
Hemoglobin: 12.7 g/dL (ref 12.0–15.0)
MCH: 28.5 pg (ref 26.0–34.0)
MCHC: 34.6 g/dL (ref 30.0–36.0)
MCV: 82.5 fL (ref 80.0–100.0)
Platelets: 247 10*3/uL (ref 150–400)
RBC: 4.45 MIL/uL (ref 3.87–5.11)
RDW: 13.6 % (ref 11.5–15.5)
WBC: 20.6 10*3/uL — ABNORMAL HIGH (ref 4.0–10.5)
nRBC: 0 % (ref 0.0–0.2)

## 2020-11-07 LAB — FERRITIN: Ferritin: 140 ng/mL (ref 11–307)

## 2020-11-07 LAB — IRON AND TIBC
Iron: 49 ug/dL (ref 28–170)
Saturation Ratios: 17 % (ref 10.4–31.8)
TIBC: 293 ug/dL (ref 250–450)
UIBC: 244 ug/dL

## 2020-11-07 LAB — RPR: RPR Ser Ql: NONREACTIVE

## 2020-11-07 LAB — LACTIC ACID, PLASMA
Lactic Acid, Venous: 1.3 mmol/L (ref 0.5–1.9)
Lactic Acid, Venous: 2 mmol/L (ref 0.5–1.9)

## 2020-11-07 LAB — TRANSFERRIN: Transferrin: 212 mg/dL (ref 192–382)

## 2020-11-07 LAB — VITAMIN D 25 HYDROXY (VIT D DEFICIENCY, FRACTURES): Vit D, 25-Hydroxy: 12.58 ng/mL — ABNORMAL LOW (ref 30–100)

## 2020-11-07 MED ORDER — LORAZEPAM 2 MG/ML IJ SOLN
1.0000 mg | INTRAMUSCULAR | Status: DC | PRN
  Administered 2020-11-07: 2 mg via INTRAVENOUS
  Filled 2020-11-07: qty 1

## 2020-11-07 MED ORDER — SODIUM CHLORIDE 0.9 % IV SOLN: INTRAVENOUS | Status: DC

## 2020-11-07 MED ORDER — LORAZEPAM 1 MG PO TABS: 1.0000 mg | ORAL_TABLET | ORAL | Status: DC | PRN

## 2020-11-07 MED ORDER — LISINOPRIL 20 MG PO TABS
40.0000 mg | ORAL_TABLET | Freq: Every day | ORAL | Status: DC
  Administered 2020-11-07 – 2020-11-09 (×3): 40 mg via ORAL
  Filled 2020-11-07 (×3): qty 2

## 2020-11-07 MED ORDER — BUPROPION HCL ER (XL) 150 MG PO TB24: 450.0000 mg | ORAL_TABLET | Freq: Every morning | ORAL | Status: DC

## 2020-11-07 MED ORDER — VENLAFAXINE HCL ER 75 MG PO CP24
225.0000 mg | ORAL_CAPSULE | Freq: Every day | ORAL | Status: DC
  Administered 2020-11-08 – 2020-11-09 (×2): 225 mg via ORAL
  Filled 2020-11-07 (×2): qty 3

## 2020-11-07 MED ORDER — ENSURE ENLIVE PO LIQD: 237.0000 mL | Freq: Two times a day (BID) | ORAL | Status: DC

## 2020-11-07 MED ORDER — AMLODIPINE BESYLATE 10 MG PO TABS
10.0000 mg | ORAL_TABLET | Freq: Every day | ORAL | Status: DC
  Administered 2020-11-07 – 2020-11-09 (×3): 10 mg via ORAL
  Filled 2020-11-07: qty 1
  Filled 2020-11-07: qty 2
  Filled 2020-11-07: qty 1

## 2020-11-07 MED ORDER — LOPERAMIDE HCL 2 MG PO CAPS
2.0000 mg | ORAL_CAPSULE | ORAL | Status: DC | PRN
  Administered 2020-11-07 – 2020-11-08 (×2): 2 mg via ORAL
  Filled 2020-11-07 (×2): qty 1

## 2020-11-07 MED ORDER — LORAZEPAM 2 MG PO TABS: 0.0000 mg | ORAL_TABLET | Freq: Two times a day (BID) | ORAL | Status: DC

## 2020-11-07 MED ORDER — LORAZEPAM 2 MG PO TABS: 0.0000 mg | ORAL_TABLET | Freq: Four times a day (QID) | ORAL | Status: DC

## 2020-11-07 MED ORDER — BUPROPION HCL ER (XL) 150 MG PO TB24: 300.0000 mg | ORAL_TABLET | Freq: Every day | ORAL | Status: DC

## 2020-11-07 MED ORDER — POTASSIUM CHLORIDE 20 MEQ PO PACK
40.0000 meq | PACK | Freq: Two times a day (BID) | ORAL | Status: AC
  Administered 2020-11-07 – 2020-11-08 (×4): 40 meq via ORAL
  Filled 2020-11-07 (×4): qty 2

## 2020-11-07 MED ORDER — TRAZODONE HCL 100 MG PO TABS
100.0000 mg | ORAL_TABLET | Freq: Every evening | ORAL | Status: DC | PRN
  Administered 2020-11-07: 100 mg via ORAL
  Filled 2020-11-07: qty 1

## 2020-11-07 NOTE — Consult Note (Signed)
MEDICATION RELATED CONSULT NOTE - INITIAL   Pharmacy Consult for drug interaction and monitoring of AEDs   Allergies  Allergen Reactions  . Aspirin Other (See Comments)    Gastric Bypass  . Ibuprofen Other (See Comments)    Gastric bypass   Medical History: Past Medical History:  Diagnosis Date  . Anxiety   . Depression    Medications:  Scheduled:  . amLODipine  10 mg Oral Daily  . enoxaparin (LOVENOX) injection  40 mg Subcutaneous Q24H  . [START ON 11/08/2020] feeding supplement  237 mL Oral BID BM  . folic acid  1 mg Oral Daily  . lisinopril  40 mg Oral Daily  . LORazepam  0-4 mg Intravenous Q6H   Or  . LORazepam  0-4 mg Oral Q6H  . [START ON 11/09/2020] LORazepam  0-4 mg Oral Q12H  . multivitamin with minerals  1 tablet Oral Daily  . potassium chloride  40 mEq Oral BID  . thiamine  100 mg Oral Daily   Or  . thiamine  100 mg Intravenous Daily  . [START ON 11/08/2020] venlafaxine XR  225 mg Oral Q breakfast   Assessment: 65 year old female presenting with altered mental status after being found on floor by roommate with jerking movements. PMH includes HTN, anxiety, and alcohol abuse. Pt drinks up to 24 beers a day but had less than usual. Pt does not have a history of seizures. Pt admitted with sepsis and seizure-like activity. Pharmacy has been consulted for drug interaction and monitoring of AEDs.  Plan:  Pt is not currently on any AEDs. Pt on lorazepam for CIWA protocol. Will continue to monitor for addition of AEDs.   Reatha Armour, PharmD Pharmacy Resident  11/07/2020 3:41 PM

## 2020-11-07 NOTE — Evaluation (Signed)
Occupational Therapy Evaluation Patient Details Name: Caroline Serrano MRN: [id number] DOB: [birth year] Today's Date: 11/07/2020    History of Present Illness Pt is a 23 transgender female to female (prefers female pronouns) with past medical history of HTN, depression and current alcohol abuse. Pt was found down by her rommate with witnessed jerking movements and brought to the ED for further evaluation. Chest x-ray, EEG and head CT negative for any acute abnormalities. Pt underwent MRI on 1/6 that was motion degraded, but also noted to have no acute abnormalities.   Clinical Impression   Pt seen for OT evaluation this date in setting of acute hospitalization d/t being found down by her roommate. Pt is on CIWA precautions at this time. Pt reports being INDEP with all aspects of self care and mobility at baseline. Pt states she drives and runs her own errands. Pt presents with some mild confusion as well as decreased strength and fxl activity tolerance this date. Pt requires increased time to CTS, but is able to do so with RW and CGA. Pt requires  MIN/MOD A for thorough completion of posterior peri care as she is found to have had an episode of bowel incontinence and is soiled with stool. OT engages pt in UB bathing with SETUP and LB bathing/dressing with MIN/MOD A. Pt left clean and dry in chair with clean linens with all needs in reach and chair alarm activated. RN notified of session and she is aware that pt's HR was up with activity. Will continue to follow acutely and anticipate pt will require HHOT f/u and SUPV for safety.     Follow Up Recommendations  Home health OT;Supervision/Assistance - 24 hour    Equipment Recommendations  3 in 1 bedside commode;Tub/shower seat;Other (comment) (2ww)    Recommendations for Other Services       Precautions / Restrictions Precautions Precautions: Fall Restrictions Weight Bearing Restrictions: No      Mobility Bed Mobility Overal bed  mobility: Needs Assistance Bed Mobility: Supine to Sit     Supine to sit: Supervision;HOB elevated     General bed mobility comments: pt up to chair pre/post session    Transfers Overall transfer level: Needs assistance Equipment used: Rolling walker (2 wheeled) Transfers: Sit to/from Stand Sit to Stand: Min guard         General transfer comment: cues for hand placement to increase safety    Balance Overall balance assessment: Needs assistance Sitting-balance support: Feet supported Sitting balance-Leahy Scale: Good       Standing balance-Leahy Scale: Fair Standing balance comment: able to statically stand without UE support but safety improved with BUE at all times in standing. Requires UE support for more dynamic tasks such as posterior peri care/LB bathing.                           ADL either performed or assessed with clinical judgement   ADL                                         General ADL Comments: Pt requires SETUP with seated UB bathing and dressing, MIN A with RW for standing LB bathing/dressing. Pt noted to have episode of bowel incontinence and requires MOD A for thorough completion of posterior peri care in standing and to prevent further spead of excrement as pt appears to have  some decreased awareness at this time. She is ver participatory and contributary.     Vision Patient Visual Report: No change from baseline       Perception     Praxis      Pertinent Vitals/Pain Pain Assessment: No/denies pain     Hand Dominance     Extremity/Trunk Assessment Upper Extremity Assessment Upper Extremity Assessment: Generalized weakness   Lower Extremity Assessment Lower Extremity Assessment: Generalized weakness   Cervical / Trunk Assessment Cervical / Trunk Assessment: Normal   Communication Communication Communication: No difficulties   Cognition Arousal/Alertness: Awake/alert Behavior During Therapy: WFL for  tasks assessed/performed Overall Cognitive Status: Impaired/Different from baseline Area of Impairment: Orientation;Following commands;Safety/judgement;Awareness                 Orientation Level: Disoriented to;Situation     Following Commands: Follows one step commands consistently;Follows one step commands with increased time Safety/Judgement: Decreased awareness of safety Awareness: Emergent   General Comments: oriented to self, place (knew it was [facility] [place]), stated the date was 1/4, when corrected and notified it was 1/6, pt endorses feeling she's lost a few days. Pt does endorse feeling confused about situation. While pt is appropriate with all commands, when OT is assisting her with bathing, it's noted that pt is soiled with stool and when asked if she noticed, pt reports she did not know she was spoiled until OT assisted with bathing/dressing.   General Comments  pt's HR up with activity, RN aware    Exercises Other Exercises Other Exercises: OT facilitates pt ed re: role of OT in acute setting, importance of OOB activity, importance of increasing strength/balance for prevention of fall with HH fxl mobility/standing self care tasks. Pt with good understanding, but some decreased awareness this date and will require f/u. In addition, will require f/u re: seated exercise. Other Exercises: Pt educated on sit <> stand technique with RW to increase safety, verbalized understanding   Shoulder Instructions      Home Living Family/patient expects to be discharged to:: Private residence Living Arrangements: Non-relatives/Friends (roommate) Available Help at Discharge: Friend(s);Available 24 hours/day Type of Home: House Home Access: Ramped entrance     Home Layout: One level     Bathroom Shower/Tub: Teacher, early years/pre: Standard     Home Equipment: Grab bars - toilet;Walker - 2 wheels;Grab bars - tub/shower;Shower seat;Wheelchair -  manual          Prior Functioning/Environment Level of Independence: Independent        Comments: no other falls, reports that she drives, performs all I/ADLs herself at baseline        OT Problem List: Decreased strength;Impaired balance (sitting and/or standing);Decreased cognition;Decreased activity tolerance      OT Treatment/Interventions:      OT Goals(Current goals can be found in the care plan section) Acute Rehab OT Goals Patient Stated Goal: to get back to normal Time For Goal Achievement: 11/21/20 Potential to Achieve Goals: Good ADL Goals Pt Will Perform Upper Body Bathing: sitting;with modified independence Pt Will Perform Lower Body Bathing: with min guard assist;sit to/from stand Pt Will Perform Upper Body Dressing: Independently;sitting Pt Will Perform Lower Body Dressing: with supervision;sit to/from stand Pt Will Transfer to Toilet: ambulating;grab bars;with supervision (with LRAD To restroom to increase fxl activity tolerance for home distances) Pt Will Perform Toileting - Clothing Manipulation and hygiene: with supervision;sit to/from stand Pt/caregiver will Perform Home Exercise Program: Increased strength;Both right and left upper extremity;With  Supervision  OT Frequency:     Barriers to D/C:            Co-evaluation              AM-PAC OT "6 Clicks" Daily Activity     Outcome Measure Help from another person eating meals?: None Help from another person taking care of personal grooming?: None Help from another person toileting, which includes using toliet, bedpan, or urinal?: A Lot Help from another person bathing (including washing, rinsing, drying)?: A Lot Help from another person to put on and taking off regular upper body clothing?: None Help from another person to put on and taking off regular lower body clothing?: A Little 6 Click Score: 19   End of Session Equipment Utilized During Treatment: Gait belt;Rolling walker Nurse  Communication: Mobility status;Other (comment) (notified of loose BM/incontinence)  Activity Tolerance: Patient tolerated treatment well Patient left: in chair;with call bell/phone within reach;with chair alarm set  OT Visit Diagnosis: Unsteadiness on feet (R26.81);Muscle weakness (generalized) (M62.81)                Time: 4536-4680 OT Time Calculation (min): 41 min Charges:  OT General Charges $OT Visit: 1 Visit OT Evaluation $OT Eval Moderate Complexity: 1 Mod OT Treatments $Self Care/Home Management : 8-22 mins $Therapeutic Activity: 8-22 mins  Rejeana Brock, MS, OTR/L ascom [phone] 11/07/20, 1:45 PM

## 2020-11-07 NOTE — Progress Notes (Addendum)
PROGRESS NOTE    Caroline Serrano  ZOX:[id number] DOB: 01-Aug-1956 DOA: 11/05/2020 PCP: Freddy Finner, NP   Brief Narrative:  Caroline Serrano is a 65 y.o. adult with medical history significant for hypertension, anxiety and alcohol abuse who was brought in after her roommate found her on the floor at home with jerking movements.    Patient is unable to contribute to history due to altered mental status.  Most of the history is taken from ED provider.  Patient usually drinks up to 24 beers a day but had less than her usual.  She has no prior history of seizures. ED Course: On arrival, blood pressure 190/154, pulse 109 respirations 25, O2 sat 100% on room air.  Blood work significant for leukocytosis of 21,000 with lactic acid of 3.7.  CMP showed creatinine of 1.6, with elevated LFTs, AST 144, ALT 73, alk phos 128, total bili 1.4.  Potassium 3.2.  Alcohol level less than 10, acetaminophen and salicylate levels undetectable.  Urinalysis pending Imaging: Chest x-ray showed no active disease.  CT head showed no acute intracranial abnormality. Patient received empiric antibiotics in the ER for possible infection.  Laceration of scalp repaired in the ER.  Hospitalist consulted for admission.   Assessment & Plan:   Sepsis: -Unknown underlying etiology?  Patient presented with tachycardia, tachypnea, altered mental status, AKI with leukocytosis of 21,000 and lactic acid of 3.7. -UA negative for infection.  Patient started on IV fluids, Rocephin and azithromycin in ED. -Chest x-ray negative for pneumonia, right upper quadrant ultrasound negative for acute cholecystitis.  Blood culture: Negative to date. -Remained afebrile.  Leukocytosis trended down from 25,000-20,000.  Lactic acid is trending down.  Continue IV fluids and antibiotics.  Seizure-like activity?: -Patient presented with jerking movements with no prior history of seizure.  Serum alcohol level: Less than 10.  CT head  negative for acute findings. -Could be in the setting of phencyclidine intoxication?  UDS is positive for phencyclidine however patient declined use of any illicit drugs -Continue Ativan as needed for seizure. -EEG negative for seizures.  MRI negative for acute findings.  Appreciate neurology's help. -On aspiration/seizure precautions.  Acute metabolic encephalopathy: Improving -In the setting of seizure versus post ictal state versus PCP intoxication Versus underlying infection or both? -CT head negative for acute findings. -Ethanol level: WNL, ammonia level: Less than 9, acetaminophen level and salicylate level: WNL.  COVID-19 negative.  B12, folate: WNL, RPR: Nonreactive TSH: Slightly elevated.  Will check free T4. -Continue with aspiration/fall precautions.  AKI: -Likely in the setting of underlying infection.  AKI resolved with IV hydration.  Elevated liver enzymes: -In the setting of alcohol abuse.  Right upper quadrant ultrasound shows mild liver surface contour nodularity raising possibility of cirrhosis.  Ammonia level: WNL.  -PT/INR: WNL, acute hepatitis panel: Negative -Patient's liver enzymes-trended up.  We will continue to monitor her liver enzymes and consult with GI if no improvement.  Alcohol use disorder: -Serum ethanol level: Less than 10. -Continue with CIWA protocol.  Hypokalemia: Replenished.  Repeat BMP tomorrow a.m.  Scalp laceration: -Sutured in ED.  Hypertension: Resume home meds amlodipine and lisinopril.  Depression/anxiety: Resume home meds-trazodone, Effexor and Wellbutrin.  Patient is going through stress, he has been depressed him try to commit suicide by taking and follow-up anxiety medication prior to the episode.  She is currently denies any suicidal or homicidal thoughts.  We will consult psych for further management.  DVT prophylaxis: Lovenox Code Status: Full code Family Communication:  None present at bedside.  Plan of care discussed with  patient in length and she verbalized understanding and agreed with it.  Disposition Plan: To be determined-pending work-up  Consultants:   Neurology  Procedures:   CT head  Antimicrobials:   Rocephin  Azithromycin  Status is: Inpatient  Dispo: The patient is from: Home              Anticipated d/c is to: Home              Anticipated d/c date is: 3 days              Patient currently is not medically stable to d/c.   Subjective: Patient seen and examined.  Sitting comfortably on recliner.  More alert and communicating well.  Reports she is not sure what happened to her yesterday-tells me that she was talking to her and suddenly she fell on her ground on facedown-moving in  circles around the floor.  She admits that she drinks beer 4-6 to binge drinking every day.  Denies smoking, illicit drug use.  As per patient's roommate-patient is depressed and tried to commit suicide by taking a handful of anxiety medications prior to the seizure-like episode.  Objective: Vitals:   11/07/20 0516 11/07/20 0727 11/07/20 1034 11/07/20 1221  BP: (!) 145/89 134/87 (!) 139/94 (!) 158/107  Pulse: (!) 101 95 (!) 103 97  Resp: 18 18  16   Temp: 98.1 F (36.7 C) 98 F (36.7 C)  98.2 F (36.8 C)  TempSrc: Oral Oral  Oral  SpO2: 100% 100%  97%  Weight:      Height:        Intake/Output Summary (Last 24 hours) at 11/07/2020 1248 Last data filed at 11/07/2020 1010 Gross per 24 hour  Intake 1770.67 ml  Output 0 ml  Net 1770.67 ml   Filed Weights   11/06/20 1400  Weight: 70.3 kg    Examination:  General exam: Appears calm and comfortable, on room air, more alert and communicating well  respiratory system: Clear to auscultation. Respiratory effort normal. Cardiovascular system: S1 & S2 heard, RRR. No JVD, murmurs, rubs, gallops or clicks. No pedal edema. Gastrointestinal system: Abdomen is nondistended, soft and nontender. No organomegaly or masses felt. Normal bowel sounds  heard. Central nervous system: Alert and oriented. No focal neurological deficits. Extremities: Symmetric 5 x 5 power. Skin: No rashes, lesions or ulcers Psychiatry: Judgement and insight appear normal. Mood & affect appropriate.    Data Reviewed: I have personally reviewed following labs and imaging studies  CBC: Recent Labs  Lab 11/05/20 2106 11/06/20 0559 11/06/20 0836 11/07/20 0459 11/07/20 0715  WBC 21.3* 24.7* 25.1* TEST WILL BE CREDITED 20.6*  NEUTROABS 19.1*  --   --   --   --   HGB 12.6 11.8* 11.8* TEST WILL BE CREDITED 12.7  HCT 36.1 33.4* 33.4* TEST WILL BE CREDITED 36.7  MCV 81.5 80.7 80.1 TEST WILL BE CREDITED 82.5  PLT 297 279 286 TEST WILL BE CREDITED 097   Basic Metabolic Panel: Recent Labs  Lab 11/05/20 2106 11/06/20 0559 11/06/20 0836 11/07/20 0459  NA 136  --  136 142  K 3.2*  --  3.2* 3.1*  CL 99  --  100 102  CO2 22  --  24 28  GLUCOSE 95  --  116* 86  BUN 24*  --  22 13  CREATININE 1.60*  --  1.34* 0.95  CALCIUM 8.6*  --  8.8*  8.9  MG  --  1.7  --  1.8  PHOS  --  2.7  --   --    GFR: Estimated Creatinine Clearance (by C-G formula based on SCr of 0.95 mg/dL) Female: 54.2 mL/min Female: 66.8 mL/min Liver Function Tests: Recent Labs  Lab 11/05/20 2106 11/06/20 0836 11/07/20 0459  AST 144* 208* 212*  ALT 73* 84* 94*  ALKPHOS 128* 100 90  BILITOT 1.4* 1.1 1.0  PROT 6.4* 6.1* 6.1*  ALBUMIN 3.8 3.4* 3.3*   Recent Labs  Lab 11/05/20 2106  LIPASE 17   Recent Labs  Lab 11/06/20 0836  AMMONIA <9*   Coagulation Profile: Recent Labs  Lab 11/06/20 0836  INR 1.1   Cardiac Enzymes: No results for input(s): CKTOTAL, CKMB, CKMBINDEX, TROPONINI in the last 168 hours. BNP (last 3 results) No results for input(s): PROBNP in the last 8760 hours. HbA1C: No results for input(s): HGBA1C in the last 72 hours. CBG: No results for input(s): GLUCAP in the last 168 hours. Lipid Profile: No results for input(s): CHOL, HDL, LDLCALC, TRIG,  CHOLHDL, LDLDIRECT in the last 72 hours. Thyroid Function Tests: Recent Labs    11/06/20 0836  TSH 0.315*   Anemia Panel: Recent Labs    11/06/20 0836 11/06/20 1325  VITAMINB12  --  404  FOLATE 9.3  --    Sepsis Labs: Recent Labs  Lab 11/05/20 2158 11/06/20 0559 11/07/20 0842 11/07/20 1201  LATICACIDVEN 3.7* 2.3* 1.3 2.0*    Recent Results (from the past 240 hour(s))  Blood culture (single)     Status: None (Preliminary result)   Collection Time: 11/05/20 10:04 PM   Specimen: BLOOD  Result Value Ref Range Status   Specimen Description BLOOD LEFT ANTECUBITAL  Final   Special Requests   Final    BOTTLES DRAWN AEROBIC AND ANAEROBIC Blood Culture adequate volume   Culture   Final    NO GROWTH 2 DAYS Performed at [facility], [address], [place], [place] [address]    Report Status PENDING  Incomplete      Radiology Studies: EEG  Result Date: 11/06/2020 Lora Havens, MD     11/06/2020  3:42 PM Patient Name: Caroline Serrano MRN: [id number] Epilepsy Attending: Lora Havens Referring Physician/Provider: Dr. Judd Gaudier Date: 11/06/2020 Duration: 21.55 minutes Patient history: 65 year old female with history of alcohol abuse who presented with witnessed seizure.  EEG to evaluate for seizures. Level of alertness: Awake AEDs during EEG study: Ativan Technical aspects: This EEG study was done with scalp electrodes positioned according to the 10-20 International system of electrode placement. Electrical activity was acquired at a sampling rate of 500Hz  and reviewed with a high frequency filter of 70Hz  and a low frequency filter of 1Hz . EEG data were recorded continuously and digitally stored. Description: No clear posterior dominant rhythm was seen.  There is an excessive amount of 15 to 18 Hz beta activity  distributed symmetrically and diffusely. Hyperventilation and photic stimulation were not performed.   ABNORMALITY -Excessive beta, generalized  IMPRESSION: This study is within normal limits. No seizures or epileptiform discharges were seen throughout the recording. The excessive beta activity seen in the background is most likely due to the effect of benzodiazepine and is a benign EEG pattern. Lora Havens   DG Chest 1 View  Result Date: 11/05/2020 CLINICAL DATA:  Altered mental status, intoxication EXAM: CHEST  1 VIEW COMPARISON:  None. FINDINGS: Normal heart size. Normal mediastinal contour. No pneumothorax. No  pleural effusion. Lungs appear clear, with no acute consolidative airspace disease and no pulmonary edema. IMPRESSION: No active disease. Electronically Signed   By: Ilona Sorrel M.D.   On: 11/05/2020 20:07   CT HEAD WO CONTRAST  Result Date: 11/05/2020 CLINICAL DATA:  Nonverbal jerking motion EXAM: CT HEAD WITHOUT CONTRAST TECHNIQUE: Contiguous axial images were obtained from the base of the skull through the vertex without intravenous contrast. COMPARISON:  None. FINDINGS: Brain: No acute territorial infarction, hemorrhage or intracranial mass. Mild to moderate atrophy. Nonenlarged ventricles. Vascular: No hyperdense vessels.  Carotid vascular calcification Skull: Normal. Negative for fracture or focal lesion. Sinuses/Orbits: No acute finding. Other: Mild right forehead scalp edema. Incomplete union posterior arch C1. Hypoplastic appearing right mandibular head. IMPRESSION: 1. No CT evidence for acute intracranial abnormality. 2. Atrophy. Electronically Signed   By: Donavan Foil M.D.   On: 11/05/2020 20:36   MR BRAIN WO CONTRAST  Result Date: 11/06/2020 CLINICAL DATA:  Seizure EXAM: MRI HEAD WITHOUT CONTRAST TECHNIQUE: Multiplanar, multiecho pulse sequences of the brain and surrounding structures were obtained without intravenous contrast. COMPARISON:  CT head 11/05/2020 FINDINGS: Brain: Image quality degraded by moderate to extensive motion which became progressive as the study progressed. No acute infarct on diffusion-weighted  imaging. Mild white matter changes. Negative for mass or fluid collection. Vascular: Normal arterial flow voids Skull and upper cervical spine: Negative Sinuses/Orbits: Paranasal sinuses clear.  Negative orbit Other: None IMPRESSION: Image quality degraded by significant motion. No acute abnormality identified. Electronically Signed   By: Franchot Gallo M.D.   On: 11/06/2020 17:12   US Abdomen Limited RUQ (LIVER/GB)  Result Date: 11/06/2020 CLINICAL DATA:  Elevated liver enzymes EXAM: ULTRASOUND ABDOMEN LIMITED RIGHT UPPER QUADRANT COMPARISON:  None. FINDINGS: Gallbladder: Cholecystectomy Common bile duct: Diameter: 4 mm, normal Liver: Few small cysts measuring up to 1.1 cm. Possible surface contour nodularity. Minimally increased parenchymal echogenicity. Portal vein is patent on color Doppler imaging with normal direction of blood flow towards the liver. Other: None. IMPRESSION: Mild liver surface contour nodularity raising possibility of cirrhosis. Minimally increased liver echogenicity may reflect steatosis or chronic liver disease. Electronically Signed   By: Macy Mis M.D.   On: 11/06/2020 09:21    Scheduled Meds: . amLODipine  10 mg Oral Daily  . enoxaparin (LOVENOX) injection  40 mg Subcutaneous Q24H  . folic acid  1 mg Oral Daily  . lisinopril  40 mg Oral Daily  . LORazepam  0-4 mg Intravenous Q6H   Or  . LORazepam  0-4 mg Oral Q6H  . multivitamin with minerals  1 tablet Oral Daily  . potassium chloride  40 mEq Oral BID  . thiamine  100 mg Oral Daily   Or  . thiamine  100 mg Intravenous Daily   Continuous Infusions: . azithromycin 500 mg (11/07/20 0224)  . cefTRIAXone (ROCEPHIN)  IV Stopped (11/06/20 1938)     LOS: 1 day   Time spent: 40 minutes.   Mckinley Jewel, MD [facility]  If 7PM-7AM, please contact night-coverage [email or web address] 11/07/2020, 12:48 PM

## 2020-11-07 NOTE — Progress Notes (Signed)
Initial Nutrition Assessment  DOCUMENTATION CODES:   Not applicable  INTERVENTION:   Ensure Enlive po BID, each supplement provides 350 kcal and 20 grams of protein  MVI, thiamine and folic acid daily in setting of etoh abuse  Pt at high refeed risk; recommend monitor potassium, magnesium and phosphorus labs daily until stable  Pt at high risk for nutrient deficiencies r/t his gastric bypass surgery. Recommend check thiamine, iron, zinc, copper and vitamin D, A, E & K labs.  NUTRITION DIAGNOSIS:   Inadequate oral intake related to acute illness as evidenced by meal completion < 50%.  GOAL:   Patient will meet greater than or equal to 90% of their needs  MONITOR:   PO intake,Supplement acceptance,Labs,Weight trends,Skin,I & O's  REASON FOR ASSESSMENT:   Malnutrition Screening Tool    ASSESSMENT:   65 y/o transgender female to female with past medical history of HTN, depression, gastric bypass in 1994 and current alcohol abuse who is admitted with seizure, sepsis and AKI  RD working remotely.  Unable to reach pt by phone. Pt seen by SLP and recommended for dysphagia 2/thin liquid diet. Pt is documented to have eaten 50% of breakfast this morning. Suspect pt with decreased appetite and oral intake at baseline r/t etoh abuse. RD will add supplements to help pt meet her estimated needs. Pt is at high refeed risk. Pt with h/o gastric bypass; RD unable to find history on what procedure was performed. Would recommend checking post bariatric vitamin labs given h/o etoh abuse and seizures. Per chart, pt appears fairly weight stable at baseline.   Pt is at high risk for malnutrition   Medications reviewed and include: lovenox, folic acid, MVI, KCl, thiamine, azithromycin, ceftriaxone   Labs reviewed: K 3.1(L), Mg 1.8 wnl B12- 404, folate 9.3- 1/5 Wbc- 20.6(H)  NUTRITION - FOCUSED PHYSICAL EXAM: Unable to perform at this time   Diet Order:   Diet Order            DIET DYS 2  Room service appropriate? Yes; Fluid consistency: Thin  Diet effective now                EDUCATION NEEDS:   No education needs have been identified at this time  Skin:  Skin Assessment: Reviewed RN Assessment (Laceration scalp)  Last BM:  1/6- TYPE 7  Height:   Ht Readings from Last 1 Encounters:  11/06/20 5\' 2"  (1.575 m)    Weight:   Wt Readings from Last 1 Encounters:  11/07/20 70.3 kg    Ideal Body Weight:  50 kg  BMI:  Body mass index is 28.35 kg/m.  Estimated Nutritional Needs:   Kcal:  1500-1700kcal/day  Protein:  75-85g/day  Fluid:  >1.5L/day  01/05/21 MS, RD, LDN Please refer to [facility] for RD and/or RD on-call/weekend/after hours pager

## 2020-11-07 NOTE — Evaluation (Addendum)
Physical Therapy Evaluation Patient Details Name: Caroline Serrano MRN: [id number] DOB: [birth year] Today's Date: 11/07/2020   History of Present Illness  Pt is a 47 transgender female to female with past medical history of HTN, depression and current alcohol abuse. Pt was found down by his girlfriend with witnessed jerking movements and brought to the ED for further evaluation. Chest x-ray, EEG and head CT negative for any acute abnormalities.    Clinical Impression  Pt alert, eager to mobilize to bathroom. PLOF gathered throughout session. Pt normally independent at baseline, lives with a roommate with ramped one story home.   The patient demonstrated sit <> stand with RW and CGA. Able to ambulate ~57ft with RW and CGA, slow cautious gait, some difficulty navigating environment with RW. Pt did have 1-2 instances of instability but able to correct with RW and CGA from PT. Toileting activities with supervision. Further ambulation held due to pt fatigue and elevated HR, in 130s with all mobility. Pt up in chair, with all needs in reach. Sit <> stand technique with RW education given several times during session to maximize safety.  Overall the patient demonstrated deficits (see "PT Problem List") that impede the patient's functional abilities, safety, and mobility and would benefit from skilled PT intervention. Recommendation is HHPT with supervision and assistance 24/7, pt agreeable to utilize RW as well. Pt may be able to borrow RW from her roommate.     Follow Up Recommendations Home health PT;Supervision/Assistance - 24 hour    Equipment Recommendations  Rolling walker with 5" wheels    Recommendations for Other Services       Precautions / Restrictions Precautions Precautions: Fall Restrictions Weight Bearing Restrictions: No      Mobility  Bed Mobility Overal bed mobility: Needs Assistance Bed Mobility: Supine to Sit     Supine to sit: Supervision;HOB elevated           Transfers Overall transfer level: Needs assistance Equipment used: Rolling walker (2 wheeled) Transfers: Sit to/from Stand Sit to Stand: Min guard         General transfer comment: cues for hand placement to increase safety  Ambulation/Gait Ambulation/Gait assistance: Min guard Gait Distance (Feet): 30 Feet Assistive device: Rolling walker (2 wheeled)   Gait velocity: decreased   General Gait Details: slow, cautious, 1-2 instances of mild unsteadiness but able to correct with CGA and RW. some difficulty navigating environment  Stairs            Wheelchair Mobility    Modified Rankin (Stroke Patients Only)       Balance Overall balance assessment: Needs assistance Sitting-balance support: Feet supported Sitting balance-Leahy Scale: Good       Standing balance-Leahy Scale: Fair Standing balance comment: able to statically stand without UE support but safety improved with BUE at all times in standing                             Pertinent Vitals/Pain Pain Assessment: No/denies pain    Home Living Family/patient expects to be discharged to:: Private residence Living Arrangements: Other relatives (roommate) Available Help at Discharge: Friend(s);Available 24 hours/day Type of Home: House Home Access: Ramped entrance     Home Layout: One level Home Equipment: Grab bars - toilet;Walker - 2 wheels;Grab bars - tub/shower;Shower seat;Wheelchair - manual      Prior Function Level of Independence: Independent         Comments: no other falls  Hand Dominance        Extremity/Trunk Assessment   Upper Extremity Assessment Upper Extremity Assessment: Defer to OT evaluation;Generalized weakness    Lower Extremity Assessment Lower Extremity Assessment: Generalized weakness    Cervical / Trunk Assessment Cervical / Trunk Assessment: Normal  Communication   Communication: No difficulties  Cognition Arousal/Alertness:  Awake/alert Behavior During Therapy: WFL for tasks assessed/performed Overall Cognitive Status: Impaired/Different from baseline Area of Impairment: Orientation                 Orientation Level: Disoriented to;Situation             General Comments: oriented to self, place (knew it was a hospital), time, asked for orientation to situation, pt endorsed feeling confused      General Comments      Exercises Other Exercises Other Exercises: Pt able to perform toileting activities with supervision Other Exercises: Pt educated on sit <> stand technique with RW to increase safety, verbalized understanding   Assessment/Plan    PT Assessment Patient needs continued PT services  PT Problem List Decreased strength;Decreased mobility;Decreased activity tolerance;Decreased balance;Decreased knowledge of use of DME;Decreased safety awareness       PT Treatment Interventions DME instruction;Therapeutic exercise;Gait training;Balance training;Stair training;Neuromuscular re-education;Functional mobility training;Therapeutic activities;Patient/family education    PT Goals (Current goals can be found in the Care Plan section)  Acute Rehab PT Goals Patient Stated Goal: to get back to normal PT Goal Formulation: With patient Time For Goal Achievement: 11/21/20 Potential to Achieve Goals: Good    Frequency Min 2X/week   Barriers to discharge        Co-evaluation               AM-PAC PT "6 Clicks" Mobility  Outcome Measure Help needed turning from your back to your side while in a flat bed without using bedrails?: None Help needed moving from lying on your back to sitting on the side of a flat bed without using bedrails?: None Help needed moving to and from a bed to a chair (including a wheelchair)?: A Little Help needed standing up from a chair using your arms (e.g., wheelchair or bedside chair)?: A Little Help needed to walk in hospital room?: A Little Help needed  climbing 3-5 steps with a railing? : A Lot 6 Click Score: 19    End of Session Equipment Utilized During Treatment: Gait belt Activity Tolerance: Patient tolerated treatment well;Other (comment) (elevated HR) Patient left: in chair;with chair alarm set;with call bell/phone within reach Nurse Communication: Mobility status PT Visit Diagnosis: Other abnormalities of gait and mobility (R26.89);Muscle weakness (generalized) (M62.81);Difficulty in walking, not elsewhere classified (R26.2)    Time: 8469-6295 PT Time Calculation (min) (ACUTE ONLY): 22 min   Charges:   PT Evaluation $PT Eval Low Complexity: 1 Low PT Treatments $Therapeutic Exercise: 8-22 mins        Olga Coaster PT, DPT 10:32 AM,11/07/20

## 2020-11-07 NOTE — Consult Note (Signed)
[facility] Face-to-Face Psychiatry Consult   Reason for Consult: Consult for this 65 year old transgender female came in with altered mental status and probable seizure Referring Physician:  Pahwani Patient Identification: Caroline Serrano MRN:  [id number] Principal Diagnosis: Alcohol withdrawal seizure with complication, with unspecified complication ([place]) Diagnosis:  Principal Problem:   Alcohol withdrawal seizure with complication, with unspecified complication ([employer]) Active Problems:   Alcohol use disorder, moderate, dependence ([place])   Acute metabolic encephalopathy   Elevated liver enzymes   Sepsis ([place])   Seizure ([place])   HTN (hypertension)   Anxiety   Scalp laceration, initial encounter   Severe recurrent major depression without psychotic features ([place])   Total Time spent with patient: 1 hour  Subjective:   Caroline Serrano is a 65 y.o. adult patient admitted with ""I cannot do what I want to do".  HPI: Patient seen chart reviewed.  This patient came into the hospital after being found evidently by his partner having what may have been a seizure.  Patient was confused on presentation.  Partner reported probable overdose of some kind of medicine.  Also concern about alcohol use.  On interview the patient was alert awake cooperative.  He tells me that he took an overdose of fluid pills.  He tells me at the time that he did it he wanted to kill himself.  He says that he feels chronically depressed.  He is very vague in his reasons for it talking about being a burden to people and not being able to accomplish what he wants to accomplish.  Patient minimizes alcohol use stating that he drinks about a case of beer every 2 weeks.  I got information from the patient's son afterwards that confirmed that the patient had probably been using almost a case a day in the last couple days.  Patient apparently is chronically dramatic stressed out and intoxicated.  He had not known of any new  suicidal statements recently.  No report of any psychotic symptoms or homicidal ideation.  I talked with the patient about his prescription for Wellbutrin and how that can increase the risk of seizures.  Patient seemed a little confused on this point but indicated that he may have been taking a higher dose than even the 450.  Past Psychiatric History: Past history of longstanding mood problems.  Multiple prior psychiatric hospitalizations.  Last one probably about 8 years ago.  Probably multiple suicide attempts.  Patient currently receives mental health treatment from his primary care doctor.  Notably the patient also has been to the transgender clinic at [facility] and has gone so far as to pursue the idea of transgender surgery as recently as a couple months ago.  Risk to Self:   Risk to Others:   Prior Inpatient Therapy:   Prior Outpatient Therapy:    Past Medical History:  Past Medical History:  Diagnosis Date  . Anxiety   . Depression     Past Surgical History:  Procedure Laterality Date  . GASTRIC BYPASS     Family History:  Family History  Problem Relation Age of Onset  . Hypertension Mother   . Stroke Mother   . Diabetes Mother   . Hypertension Father   . Diabetes Paternal Grandmother    Family Psychiatric  History: None reported Social History:  Social History   Substance and Sexual Activity  Alcohol Use Yes   Comment: only occasional     Social History   Substance and Sexual Activity  Drug Use No  Social History   Socioeconomic History  . Marital status: Divorced    Spouse name: Not on file  . Number of children: Not on file  . Years of education: Not on file  . Highest education level: Not on file  Occupational History  . Not on file  Tobacco Use  . Smoking status: Never Smoker  . Smokeless tobacco: Never Used  Vaping Use  . Vaping Use: Never used  Substance and Sexual Activity  . Alcohol use: Yes    Comment: only occasional  . Drug use: No  . Sexual  activity: Not on file  Other Topics Concern  . Not on file  Social History Narrative  . Not on file   Social Determinants of Health   Financial Resource Strain: Not on file  Food Insecurity: Not on file  Transportation Needs: Not on file  Physical Activity: Not on file  Stress: Not on file  Social Connections: Not on file   Additional Social History:    Allergies:   Allergies  Allergen Reactions  . Aspirin Other (See Comments)    Gastric Bypass  . Ibuprofen Other (See Comments)    Gastric bypass    Labs:  Results for orders placed or performed during the hospital encounter of 11/05/20 (from the past 48 hour(s))  Urinalysis, Complete w Microscopic Urine, Catheterized     Status: Abnormal   Collection Time: 11/05/20  7:52 PM  Result Value Ref Range   Color, Urine YELLOW (A) YELLOW   APPearance HAZY (A) CLEAR   Specific Gravity, Urine 1.012 1.005 - 1.030   pH 5.0 5.0 - 8.0   Glucose, UA NEGATIVE NEGATIVE mg/dL   Hgb urine dipstick MODERATE (A) NEGATIVE   Bilirubin Urine NEGATIVE NEGATIVE   Ketones, ur NEGATIVE NEGATIVE mg/dL   Protein, ur NEGATIVE NEGATIVE mg/dL   Nitrite NEGATIVE NEGATIVE   Leukocytes,Ua NEGATIVE NEGATIVE   WBC, UA 0-5 0 - 5 WBC/hpf   Bacteria, UA NONE SEEN NONE SEEN   Squamous Epithelial / LPF NONE SEEN 0 - 5   Mucus PRESENT    Hyaline Casts, UA PRESENT     Comment: Performed at [facility], [address], [place], [place] [address]  Urine Drug Screen, Qualitative     Status: Abnormal   Collection Time: 11/05/20  7:52 PM  Result Value Ref Range   Tricyclic, Ur Screen NONE DETECTED NONE DETECTED   Amphetamines, Ur Screen NONE DETECTED NONE DETECTED   MDMA (Ecstasy)Ur Screen NONE DETECTED NONE DETECTED   Cocaine Metabolite,Ur [place] NONE DETECTED NONE DETECTED   Opiate, Ur Screen NONE DETECTED NONE DETECTED   Phencyclidine (PCP) Ur S POSITIVE (A) NONE DETECTED   Cannabinoid 50 Ng, Ur [place] NONE DETECTED NONE DETECTED   Barbiturates, Ur  Screen NONE DETECTED NONE DETECTED   Benzodiazepine, Ur Scrn NONE DETECTED NONE DETECTED   Methadone Scn, Ur NONE DETECTED NONE DETECTED    Comment: (NOTE) Tricyclics + metabolites, urine    Cutoff 1000 ng/mL Amphetamines + metabolites, urine  Cutoff 1000 ng/mL MDMA (Ecstasy), urine              Cutoff 500 ng/mL Cocaine Metabolite, urine          Cutoff 300 ng/mL Opiate + metabolites, urine        Cutoff 300 ng/mL Phencyclidine (PCP), urine         Cutoff 25 ng/mL Cannabinoid, urine  Cutoff 50 ng/mL Barbiturates + metabolites, urine  Cutoff 200 ng/mL Benzodiazepine, urine              Cutoff 200 ng/mL Methadone, urine                   Cutoff 300 ng/mL  The urine drug screen provides only a preliminary, unconfirmed analytical test result and should not be used for non-medical purposes. Clinical consideration and professional judgment should be applied to any positive drug screen result due to possible interfering substances. A more specific alternate chemical method must be used in order to obtain a confirmed analytical result. Gas chromatography / mass spectrometry (GC/MS) is the preferred confirm atory method. Performed at [facility], [address] [place], [place] [address]   Comprehensive metabolic panel     Status: Abnormal   Collection Time: 11/05/20  9:06 PM  Result Value Ref Range   Sodium 136 135 - 145 mmol/L   Potassium 3.2 (L) 3.5 - 5.1 mmol/L   Chloride 99 98 - 111 mmol/L   CO2 22 22 - 32 mmol/L   Glucose, Bld 95 70 - 99 mg/dL    Comment: Glucose reference range applies only to samples taken after fasting for at least 8 hours.   BUN 24 (H) 8 - 23 mg/dL   Creatinine, Ser 1.60 (H) 0.44 - 1.00 mg/dL   Calcium 8.6 (L) 8.9 - 10.3 mg/dL   Total Protein 6.4 (L) 6.5 - 8.1 g/dL   Albumin 3.8 3.5 - 5.0 g/dL   AST 144 (H) 15 - 41 U/L   ALT 73 (H) 0 - 44 U/L   Alkaline Phosphatase 128 (H) 38 - 126 U/L   Total Bilirubin 1.4 (H) 0.3 - 1.2 mg/dL    GFR, Estimated 36 (L) >60 mL/min    Comment: (NOTE) Calculated using the CKD-EPI Creatinine Equation (2021)    Anion gap 15 5 - 15    Comment: Performed at [facility], [address] [place], [place] [address]  Ethanol     Status: None   Collection Time: 11/05/20  9:06 PM  Result Value Ref Range   Alcohol, Ethyl (B) <10 <10 mg/dL    Comment: (NOTE) Lowest detectable limit for serum alcohol is 10 mg/dL.  For medical purposes only. Performed at [facility], [address] [place], [place] [address]   Salicylate level     Status: Abnormal   Collection Time: 11/05/20  9:06 PM  Result Value Ref Range   Salicylate Lvl <3.0 (L) 7.0 - 30.0 mg/dL    Comment: Performed at [facility], [address] [place], [place] [address]  Acetaminophen level     Status: Abnormal   Collection Time: 11/05/20  9:06 PM  Result Value Ref Range   Acetaminophen (Tylenol), Serum <10 (L) 10 - 30 ug/mL    Comment: (NOTE) Therapeutic concentrations vary significantly. A range of 10-30 ug/mL  may be an effective concentration for many patients. However, some  are best treated at concentrations outside of this range. Acetaminophen concentrations >150 ug/mL at 4 hours after ingestion  and >50 ug/mL at 12 hours after ingestion are often associated with  toxic reactions.  Performed at [facility], [address] [place], [place] [address]   Lipase, blood     Status: None   Collection Time: 11/05/20  9:06 PM  Result Value Ref Range   Lipase 17 11 - 51 U/L    Comment: Performed at  [facility], [address] [place], [place] [address]  CBC with Differential     Status: Abnormal   Collection Time: 11/05/20  9:06 PM  Result Value Ref Range   WBC 21.3 (H) 4.0 - 10.5 K/uL   RBC 4.43 3.87 - 5.11 MIL/uL   Hemoglobin 12.6 12.0 - 15.0 g/dL   HCT 36.1 36.0 - 46.0 %   MCV 81.5 80.0 - 100.0 fL   MCH 28.4 26.0 - 34.0 pg   MCHC 34.9 30.0 - 36.0 g/dL   RDW  13.8 11.5 - 15.5 %   Platelets 297 150 - 400 K/uL   nRBC 0.0 0.0 - 0.2 %   Neutrophils Relative % 91 %   Neutro Abs 19.1 (H) 1.7 - 7.7 K/uL   Lymphocytes Relative 3 %   Lymphs Abs 0.7 0.7 - 4.0 K/uL   Monocytes Relative 6 %   Monocytes Absolute 1.4 (H) 0.1 - 1.0 K/uL   Eosinophils Relative 0 %   Eosinophils Absolute 0.0 0.0 - 0.5 K/uL   Basophils Relative 0 %   Basophils Absolute 0.0 0.0 - 0.1 K/uL   Immature Granulocytes 0 %   Abs Immature Granulocytes 0.09 (H) 0.00 - 0.07 K/uL    Comment: Performed at [facility], [address] [place], [place] [address]  Lactic acid, plasma     Status: Abnormal   Collection Time: 11/05/20  9:58 PM  Result Value Ref Range   Lactic Acid, Venous 3.7 (HH) 0.5 - 1.9 mmol/L    Comment: CRITICAL RESULT CALLED TO, READ BACK BY AND VERIFIED WITH DONNA FOSTER 11/05/20 2321 KBH Performed at [facility], [address] [place], [place] [address]   Blood culture (single)     Status: None (Preliminary result)   Collection Time: 11/05/20 10:04 PM   Specimen: BLOOD  Result Value Ref Range   Specimen Description BLOOD LEFT ANTECUBITAL    Special Requests      BOTTLES DRAWN AEROBIC AND ANAEROBIC Blood Culture adequate volume   Culture      NO GROWTH 2 DAYS Performed at [facility], [address] [place], [place] [address]    Report Status PENDING   POC SARS Coronavirus 2 Ag-ED - Nasal Swab (BD Veritor Kit)     Status: None   Collection Time: 11/06/20  5:39 AM  Result Value Ref Range   SARS Coronavirus 2 Ag NEGATIVE NEGATIVE    Comment: (NOTE) SARS-CoV-2 antigen NOT DETECTED.   Negative results are presumptive.  Negative results do not preclude SARS-CoV-2 infection and should not be used as the sole basis for treatment or other patient management decisions, including infection  control decisions, particularly in the presence of clinical signs and  symptoms consistent with COVID-19, or in those who have been  in contact with the virus.  Negative results must be combined with clinical observations, patient history, and epidemiological information. The expected result is Negative.  Fact Sheet for Patients: PodPark.tn  Fact Sheet for Healthcare Providers: GiftContent.is   This test is not yet approved or cleared by the [place] FDA and  has been authorized for detection and/or diagnosis of SARS-CoV-2 by FDA under an Emergency Use Authorization (EUA).  This EUA will remain in effect (meaning this test can be used) for the duration of  the C OVID-19 declaration under Section 564(b)(1) of the Act, 21 U.S.C. section 360bbb-3(b)(1), unless the authorization is terminated or revoked sooner.    Lactic acid, plasma  Status: Abnormal   Collection Time: 11/06/20  5:59 AM  Result Value Ref Range   Lactic Acid, Venous 2.3 (HH) 0.5 - 1.9 mmol/L    Comment: CRITICAL RESULT CALLED TO, READ BACK BY AND VERIFIED WITH LORRIE LEMONS 11/06/20 0702 KLW Performed at [facility], [address] [place], [place] [address]   Magnesium     Status: None   Collection Time: 11/06/20  5:59 AM  Result Value Ref Range   Magnesium 1.7 1.7 - 2.4 mg/dL    Comment: Performed at [facility], [address] [place], [place] [address]  Phosphorus     Status: None   Collection Time: 11/06/20  5:59 AM  Result Value Ref Range   Phosphorus 2.7 2.5 - 4.6 mg/dL    Comment: Performed at [facility], [address] [place], [place] [address]  CBC     Status: Abnormal   Collection Time: 11/06/20  5:59 AM  Result Value Ref Range   WBC 24.7 (H) 4.0 - 10.5 K/uL   RBC 4.14 3.87 - 5.11 MIL/uL   Hemoglobin 11.8 (L) 12.0 - 15.0 g/dL   HCT 33.4 (L) 36.0 - 46.0 %   MCV 80.7 80.0 - 100.0 fL   MCH 28.5 26.0 - 34.0 pg   MCHC 35.3 30.0 - 36.0 g/dL   RDW 13.6 11.5 - 15.5 %   Platelets 279 150 - 400 K/uL   nRBC 0.0 0.0 - 0.2 %     Comment: Performed at [facility], [address] [place], [place] [address]  HIV Antibody (routine testing w rflx)     Status: None   Collection Time: 11/06/20  5:59 AM  Result Value Ref Range   HIV Screen 4th Generation wRfx Non Reactive Non Reactive    Comment: Performed at [facility], [address], [place], [place] [address]  Comprehensive metabolic panel     Status: Abnormal   Collection Time: 11/06/20  8:36 AM  Result Value Ref Range   Sodium 136 135 - 145 mmol/L   Potassium 3.2 (L) 3.5 - 5.1 mmol/L   Chloride 100 98 - 111 mmol/L   CO2 24 22 - 32 mmol/L   Glucose, Bld 116 (H) 70 - 99 mg/dL    Comment: Glucose reference range applies only to samples taken after fasting for at least 8 hours.   BUN 22 8 - 23 mg/dL   Creatinine, Ser 1.34 (H) 0.44 - 1.00 mg/dL   Calcium 8.8 (L) 8.9 - 10.3 mg/dL   Total Protein 6.1 (L) 6.5 - 8.1 g/dL   Albumin 3.4 (L) 3.5 - 5.0 g/dL   AST 208 (H) 15 - 41 U/L   ALT 84 (H) 0 - 44 U/L   Alkaline Phosphatase 100 38 - 126 U/L   Total Bilirubin 1.1 0.3 - 1.2 mg/dL   GFR, Estimated 44 (L) >60 mL/min    Comment: (NOTE) Calculated using the CKD-EPI Creatinine Equation (2021)    Anion gap 12 5 - 15    Comment: Performed at [facility], [address] [place], [place] [address]  Hepatitis panel, acute     Status: None   Collection Time: 11/06/20  8:36 AM  Result Value Ref Range   Hepatitis B Surface Ag NON REACTIVE NON REACTIVE   HCV Ab NON REACTIVE NON REACTIVE    Comment: (NOTE) Nonreactive HCV antibody screen is consistent with no HCV infections,  unless recent infection is suspected or other evidence  exists to indicate HCV infection.     Hep A IgM NON REACTIVE NON REACTIVE   Hep B C IgM NON REACTIVE NON REACTIVE    Comment: Performed at [facility], [address] [place], [place] [address]  Protime-INR     Status: None   Collection Time: 11/06/20  8:36 AM  Result Value Ref Range   Prothrombin Time  13.7 11.4 - 15.2 seconds   INR 1.1 0.8 - 1.2    Comment: (NOTE) INR goal varies based on device and disease states. Performed at [facility], [address] [place], [place] [address]   CBC     Status: Abnormal   Collection Time: 11/06/20  8:36 AM  Result Value Ref Range   WBC 25.1 (H) 4.0 - 10.5 K/uL   RBC 4.17 3.87 - 5.11 MIL/uL   Hemoglobin 11.8 (L) 12.0 - 15.0 g/dL   HCT 33.4 (L) 36.0 - 46.0 %   MCV 80.1 80.0 - 100.0 fL   MCH 28.3 26.0 - 34.0 pg   MCHC 35.3 30.0 - 36.0 g/dL   RDW 13.5 11.5 - 15.5 %   Platelets 286 150 - 400 K/uL   nRBC 0.0 0.0 - 0.2 %    Comment: Performed at [facility], [address] [place], [place] [address]  Ammonia     Status: Abnormal   Collection Time: 11/06/20  8:36 AM  Result Value Ref Range   Ammonia <9 (L) 9 - 35 umol/L    Comment: Performed at [facility], [address] [place], [place] [address]  TSH     Status: Abnormal   Collection Time: 11/06/20  8:36 AM  Result Value Ref Range   TSH 0.315 (L) 0.350 - 4.500 uIU/mL    Comment: Performed by a 3rd Generation assay with a functional sensitivity of <=0.01 uIU/mL. Performed at [facility], [address] [place], [place] [address]   Folate, serum, performed at [facility]     Status: None   Collection Time: 11/06/20  8:36 AM  Result Value Ref Range   Folate 9.3 >5.9 ng/mL    Comment: Performed at [facility], [address] [place], [place] [address]  RPR     Status: None   Collection Time: 11/06/20  1:25 PM  Result Value Ref Range   RPR Ser Ql NON REACTIVE NON REACTIVE    Comment: Performed at [place] [address], [place], [place] [address]  Vitamin B12     Status: None   Collection Time: 11/06/20  1:25 PM  Result Value Ref Range   Vitamin B-12 404 180 - 914 pg/mL    Comment: (NOTE) This assay is not validated for testing neonatal or myeloproliferative syndrome specimens for Vitamin B12 levels. Performed  at [facility], [place] [address] [place], [place] [address]   CBC     Status: None   Collection Time: 11/07/20  4:59 AM  Result Value Ref Range   WBC TEST WILL BE CREDITED 4.0 - 10.5 K/uL   RBC TEST WILL BE CREDITED 3.87 - 5.11 MIL/uL   Hemoglobin TEST WILL BE CREDITED 12.0 - 15.0 g/dL   HCT TEST WILL BE CREDITED 36.0 - 46.0 %   MCV TEST WILL BE CREDITED 80.0 - 100.0 fL   MCH TEST WILL BE CREDITED 26.0 - 34.0 pg   MCHC TEST WILL BE CREDITED 30.0 - 36.0 g/dL   RDW TEST WILL  BE CREDITED 11.5 - 15.5 %   Platelets TEST WILL BE CREDITED 150 - 400 K/uL   nRBC TEST WILL BE CREDITED 0.0 - 0.2 %    Comment: Performed at [facility], [address] [place], [place] [address]  Comprehensive metabolic panel     Status: Abnormal   Collection Time: 11/07/20  4:59 AM  Result Value Ref Range   Sodium 142 135 - 145 mmol/L   Potassium 3.1 (L) 3.5 - 5.1 mmol/L   Chloride 102 98 - 111 mmol/L   CO2 28 22 - 32 mmol/L   Glucose, Bld 86 70 - 99 mg/dL    Comment: Glucose reference range applies only to samples taken after fasting for at least 8 hours.   BUN 13 8 - 23 mg/dL   Creatinine, Ser 0.95 0.44 - 1.00 mg/dL   Calcium 8.9 8.9 - 10.3 mg/dL   Total Protein 6.1 (L) 6.5 - 8.1 g/dL   Albumin 3.3 (L) 3.5 - 5.0 g/dL   AST 212 (H) 15 - 41 U/L   ALT 94 (H) 0 - 44 U/L   Alkaline Phosphatase 90 38 - 126 U/L   Total Bilirubin 1.0 0.3 - 1.2 mg/dL   GFR, Estimated >60 >60 mL/min    Comment: (NOTE) Calculated using the CKD-EPI Creatinine Equation (2021)    Anion gap 12 5 - 15    Comment: Performed at [facility], [address] [place], [place] [address]  Magnesium     Status: None   Collection Time: 11/07/20  4:59 AM  Result Value Ref Range   Magnesium 1.8 1.7 - 2.4 mg/dL    Comment: Performed at [facility], [address] [place], [place] [address]  CBC     Status: Abnormal   Collection Time: 11/07/20  7:15 AM  Result Value Ref Range   WBC 20.6 (H) 4.0 -  10.5 K/uL   RBC 4.45 3.87 - 5.11 MIL/uL   Hemoglobin 12.7 12.0 - 15.0 g/dL   HCT 36.7 36.0 - 46.0 %   MCV 82.5 80.0 - 100.0 fL   MCH 28.5 26.0 - 34.0 pg   MCHC 34.6 30.0 - 36.0 g/dL   RDW 13.6 11.5 - 15.5 %   Platelets 247 150 - 400 K/uL   nRBC 0.0 0.0 - 0.2 %    Comment: Performed at [facility], [address] [place], [place] [address]  Lactic acid, plasma     Status: None   Collection Time: 11/07/20  8:42 AM  Result Value Ref Range   Lactic Acid, Venous 1.3 0.5 - 1.9 mmol/L    Comment: Performed at [facility], [address] [place], [place] [address]  Lactic acid, plasma     Status: Abnormal   Collection Time: 11/07/20 12:01 PM  Result Value Ref Range   Lactic Acid, Venous 2.0 (HH) 0.5 - 1.9 mmol/L    Comment: CRITICAL VALUE NOTED. VALUE IS CONSISTENT WITH PREVIOUSLY REPORTED/CALLED VALUE/HKP Performed at [facility], [address] [place],  [address]     Current Facility-Administered Medications  Medication Dose Route Frequency Provider Last Rate Last Admin  . 0.9 %  sodium chloride infusion   Intravenous Continuous Pahwani, Rinka R, MD 100 mL/hr at 11/07/20 1833 New Bag at 11/07/20 1833  . acetaminophen (TYLENOL) tablet 650 mg  650 mg Oral Q4H PRN Athena Masse, MD   650 mg at 11/06/20 1350   Or  . acetaminophen (TYLENOL) suppository 650  mg  650 mg Rectal Q4H PRN Athena Masse, MD      . amLODipine (NORVASC) tablet 10 mg  10 mg Oral Daily Pahwani, Rinka R, MD   10 mg at 11/07/20 1432  . azithromycin (ZITHROMAX) 500 mg in sodium chloride 0.9 % 250 mL IVPB  500 mg Intravenous Q24H Judd Gaudier V, MD 250 mL/hr at 11/07/20 0224 500 mg at 11/07/20 0224  . cefTRIAXone (ROCEPHIN) 2 g in sodium chloride 0.9 % 100 mL IVPB  2 g Intravenous Q24H Judd Gaudier V, MD 200 mL/hr at 11/07/20 1839 2 g at 11/07/20 1839  . enoxaparin (LOVENOX) injection 40 mg  40 mg Subcutaneous Q24H Athena Masse, MD   40 mg at 11/07/20 1634  . [START ON  11/08/2020] feeding supplement (ENSURE ENLIVE / ENSURE PLUS) liquid 237 mL  237 mL Oral BID BM Pahwani, Rinka R, MD      . folic acid (FOLVITE) tablet 1 mg  1 mg Oral Daily Athena Masse, MD   1 mg at 11/07/20 1022  . lisinopril (ZESTRIL) tablet 40 mg  40 mg Oral Daily Pahwani, Rinka R, MD   40 mg at 11/07/20 1432  . loperamide (IMODIUM) capsule 2 mg  2 mg Oral PRN Pahwani, Rinka R, MD   2 mg at 11/07/20 1634  . LORazepam (ATIVAN) injection 0-4 mg  0-4 mg Intravenous Q6H Carrie Mew, MD   1 mg at 11/06/20 2016   Or  . LORazepam (ATIVAN) tablet 0-4 mg  0-4 mg Oral Q6H Carrie Mew, MD   1 mg at 11/07/20 1514  . LORazepam (ATIVAN) tablet 1-4 mg  1-4 mg Oral Q1H PRN Pahwani, Michell Heinrich, MD       Or  . LORazepam (ATIVAN) injection 1-4 mg  1-4 mg Intravenous Q1H PRN Pahwani, Rinka R, MD      . Derrill Memo ON 11/09/2020] LORazepam (ATIVAN) tablet 0-4 mg  0-4 mg Oral Q12H Pahwani, Rinka R, MD      . multivitamin with minerals tablet 1 tablet  1 tablet Oral Daily Athena Masse, MD   1 tablet at 11/07/20 1022  . ondansetron (ZOFRAN) tablet 4 mg  4 mg Oral Q6H PRN Athena Masse, MD       Or  . ondansetron [facility]) injection 4 mg  4 mg Intravenous Q6H PRN Athena Masse, MD      . potassium chloride (KLOR-CON) packet 40 mEq  40 mEq Oral BID Pahwani, Rinka R, MD   40 mEq at 11/07/20 1023  . thiamine tablet 100 mg  100 mg Oral Daily Carrie Mew, MD   100 mg at 11/07/20 1022   Or  . thiamine (B-1) injection 100 mg  100 mg Intravenous Daily Carrie Mew, MD   100 mg at 11/06/20 1020  . traZODone (DESYREL) tablet 100 mg  100 mg Oral QHS PRN Pahwani, Rinka R, MD      . Derrill Memo ON 11/08/2020] venlafaxine XR (EFFEXOR-XR) 24 hr capsule 225 mg  225 mg Oral Q breakfast Pahwani, Rinka R, MD        Musculoskeletal: Strength & Muscle Tone: within normal limits Gait & Station: unsteady Patient leans: N/A  Psychiatric Specialty Exam: Physical Exam Constitutional:      Appearance: She is  well-developed and well-nourished.  HENT:     Head: Normocephalic and atraumatic.  Eyes:     Conjunctiva/sclera: Conjunctivae normal.     Pupils: Pupils are equal, round, and reactive to light.  Cardiovascular:  Heart sounds: Normal heart sounds.  Pulmonary:     Effort: Pulmonary effort is normal.  Abdominal:     Palpations: Abdomen is soft.  Musculoskeletal:        General: Normal range of motion.     Cervical back: Normal range of motion.  Skin:    General: Skin is warm and dry.  Neurological:     Mental Status: She is alert.  Psychiatric:        Attention and Perception: Attention normal.        Mood and Affect: Mood is depressed.        Speech: Speech is delayed.        Behavior: Behavior is slowed.        Thought Content: Thought content includes suicidal ideation. Thought content does not include suicidal plan.        Cognition and Memory: Cognition normal.        Judgment: Judgment is impulsive.     Review of Systems  Constitutional: Negative.   HENT: Negative.   Eyes: Negative.   Respiratory: Negative.   Cardiovascular: Negative.   Gastrointestinal: Negative.   Musculoskeletal: Negative.   Skin: Negative.   Neurological: Negative.   Psychiatric/Behavioral: Positive for dysphoric mood, self-injury and suicidal ideas.    Blood pressure (!) 157/94, pulse (!) 104, temperature 98.1 F (36.7 C), temperature source Oral, resp. rate 17, height 5' 2" (1.575 m), weight 70.3 kg, SpO2 100 %.Body mass index is 28.35 kg/m.  General Appearance: Casual  Eye Contact:  Good  Speech:  Clear and Coherent  Volume:  Decreased  Mood:  Depressed  Affect:  Full Range  Thought Process:  Coherent  Orientation:  Full (Time, Place, and Person)  Thought Content:  Logical  Suicidal Thoughts:  Yes.  without intent/plan  Homicidal Thoughts:  No  Memory:  Immediate;   Fair Recent;   Poor Remote;   Fair  Judgement:  Impaired  Insight:  Shallow  Psychomotor Activity:  Decreased   Concentration:  Concentration: Fair  Recall:  [employer] of Knowledge:  Fair  Language:  Fair  Akathisia:  No  Handed:  Right  AIMS (if indicated):     Assets:  Desire for Improvement Housing  ADL's:  Impaired  Cognition:  Impaired,  Mild  Sleep:        Treatment Plan Summary: Medication management and Plan Spoke with the patient and then with her son afterwards.  1 option would be to consider referral to geriatric psychiatry although it sounds like his mood is a chronic issue and as the son pointed out may not be likely to improve from a hospital stay.  Alcohol abuse also appears to be a chronic issue that he has little inclination to change.  I will continue to visit the patient.  Continue to stay off of the bupropion for now.  Monitor for any sign of delirium tremens.  Disposition: Supportive therapy provided about ongoing stressors. Discussed crisis plan, support from social network, calling 911, coming to the Emergency Department, and calling Suicide Hotline.  Alethia Berthold, MD 11/07/2020 6:44 PM

## 2020-11-07 NOTE — Progress Notes (Incomplete)
Patient visitor at bedside. Visitor states she is patient's POA. Visitor instructed to bring documentation. Visitor states patient disclosed to her that she "took anxiety pills PTA with intent to kill herself". RN at bedside. Patient alert with intermittent confusion. Patient MD notified via secure chat of situation. Patient states she has been under stress.   Patient low bed in lowest position with rails up. Visitor at bedside.

## 2020-11-07 NOTE — Progress Notes (Signed)
PIV consult: Found pt with blue tourniquet on L forearm. Tourniquet released, arm unremarkable. Pt denied pain in area. PIV placed in R upper arm. RN made aware.

## 2020-11-07 NOTE — Progress Notes (Signed)
Brief Neurology Progress Note  Imaging I have reviewed images in epic and the results pertinent to this consultation are: MRI Brain showed motion degraded images without acute abnormality seen. identified. EEG 11-06-2020 showed This study is within normal limits. No seizures or epileptiform discharges were seen throughout the recording.  Assessment: Caroline Serrano is a 65 y.o. adult PMHx alcohol abuse last drink the night PTA with witnessed seizure. She has never had a seizure in the past and therefore seizure most likely was provoked (withdrawal, metabolic, infections, stroke). She states that she is back to baseline and will need further imaging studies and routine EEG as part of seizure workup. No indication to start antiseizure medication at this time.   Recommendations: Continue treatment of alcohol withdrawal/nutrional supplementation. No indication for antiseizure medications. Neurology will remain available, please call for questions.  Electronically signed by:  Marisue Humble, MD Page: [phone] 11/07/2020, 12:09 PM

## 2020-11-08 DIAGNOSIS — R569 Unspecified convulsions: Secondary | ICD-10-CM | POA: Diagnosis not present

## 2020-11-08 DIAGNOSIS — F10239 Alcohol dependence with withdrawal, unspecified: Secondary | ICD-10-CM | POA: Diagnosis not present

## 2020-11-08 LAB — CBC
HCT: 34.4 % — ABNORMAL LOW (ref 36.0–46.0)
Hemoglobin: 11.5 g/dL — ABNORMAL LOW (ref 12.0–15.0)
MCH: 28.3 pg (ref 26.0–34.0)
MCHC: 33.4 g/dL (ref 30.0–36.0)
MCV: 84.5 fL (ref 80.0–100.0)
Platelets: 274 10*3/uL (ref 150–400)
RBC: 4.07 MIL/uL (ref 3.87–5.11)
RDW: 14.1 % (ref 11.5–15.5)
WBC: 15.7 10*3/uL — ABNORMAL HIGH (ref 4.0–10.5)
nRBC: 0.1 % (ref 0.0–0.2)

## 2020-11-08 LAB — COMPREHENSIVE METABOLIC PANEL
ALT: 85 U/L — ABNORMAL HIGH (ref 0–44)
AST: 131 U/L — ABNORMAL HIGH (ref 15–41)
Albumin: 3.2 g/dL — ABNORMAL LOW (ref 3.5–5.0)
Alkaline Phosphatase: 87 U/L (ref 38–126)
Anion gap: 9 (ref 5–15)
BUN: 9 mg/dL (ref 8–23)
CO2: 27 mmol/L (ref 22–32)
Calcium: 8.8 mg/dL — ABNORMAL LOW (ref 8.9–10.3)
Chloride: 105 mmol/L (ref 98–111)
Creatinine, Ser: 0.91 mg/dL (ref 0.44–1.00)
GFR, Estimated: >60 mL/min (ref >60)
Glucose, Bld: 92 mg/dL (ref 70–99)
Potassium: 4.4 mmol/L (ref 3.5–5.1)
Sodium: 141 mmol/L (ref 135–145)
Total Bilirubin: 1 mg/dL (ref 0.3–1.2)
Total Protein: 5.7 g/dL — ABNORMAL LOW (ref 6.5–8.1)

## 2020-11-08 LAB — LACTIC ACID, PLASMA
Lactic Acid, Venous: 1.3 mmol/L (ref 0.5–1.9)
Lactic Acid, Venous: 3.1 mmol/L (ref 0.5–1.9)

## 2020-11-08 LAB — PROCALCITONIN: Procalcitonin: 1.14 ng/mL

## 2020-11-08 LAB — T4, FREE: Free T4: 1.34 ng/dL — ABNORMAL HIGH (ref 0.61–1.12)

## 2020-11-08 MED ORDER — METOPROLOL SUCCINATE ER 25 MG PO TB24
25.0000 mg | ORAL_TABLET | Freq: Every day | ORAL | Status: DC
  Administered 2020-11-08 – 2020-11-09 (×2): 25 mg via ORAL
  Filled 2020-11-08 (×2): qty 1

## 2020-11-08 MED ORDER — BUPROPION HCL ER (XL) 150 MG PO TB24
300.0000 mg | ORAL_TABLET | Freq: Every day | ORAL | Status: DC
  Administered 2020-11-08 – 2020-11-09 (×2): 300 mg via ORAL
  Filled 2020-11-08 (×2): qty 2

## 2020-11-08 MED ORDER — VITAMIN D 25 MCG (1000 UNIT) PO TABS
3000.0000 [IU] | ORAL_TABLET | Freq: Every day | ORAL | Status: DC
  Administered 2020-11-08: 3000 [IU] via ORAL
  Filled 2020-11-08: qty 3

## 2020-11-08 NOTE — Progress Notes (Signed)
PROGRESS NOTE    Caroline Serrano  UXN:[id number] DOB: 07/12/1956 DOA: 11/05/2020 PCP: Freddy Finner, NP   Brief Narrative:  Caroline Serrano is a 65 y.o. adult with medical history significant for hypertension, anxiety and alcohol abuse who was brought in after her roommate found her on the floor at home with jerking movements.    Patient is unable to contribute to history due to altered mental status.  Most of the history is taken from ED provider.  Patient usually drinks up to 24 beers a day but had less than her usual.  She has no prior history of seizures. ED Course: On arrival, blood pressure 190/154, pulse 109 respirations 25, O2 sat 100% on room air.  Blood work significant for leukocytosis of 21,000 with lactic acid of 3.7.  CMP showed creatinine of 1.6, with elevated LFTs, AST 144, ALT 73, alk phos 128, total bili 1.4.  Potassium 3.2.  Alcohol level less than 10, acetaminophen and salicylate levels undetectable.  Urinalysis pending Imaging: Chest x-ray showed no active disease.  CT head showed no acute intracranial abnormality. Patient received empiric antibiotics in the ER for possible infection.  Laceration of scalp repaired in the ER.  Hospitalist consulted for admission.   Assessment & Plan:   Sepsis: -Unknown underlying etiology?  Patient presented with tachycardia, tachypnea, altered mental status, AKI with leukocytosis of 21,000 and lactic acid of 3.7. -UA negative for infection.  Patient started on IV fluids, Rocephin and azithromycin in ED. -Chest x-ray negative for pneumonia, right upper quadrant ultrasound negative for acute cholecystitis.  Blood culture: Negative to date. -Remained afebrile.  Leukocytosis trended down from 25,000-20,000-15,000.  Lactic acid is trending down to normal.   -We will discontinue IV fluids and antibiotics at this time.  Seizure-like activity?-Likely in the setting of alcohol intoxication/withdrawal: -Patient presented with  jerking movements with no prior history of seizure.  Serum alcohol level: Less than 10.  CT head negative for acute findings. -UDS is positive for phencyclidine however patient declined use of any illicit drugs-Grippi false positive -Continue Ativan as needed for seizure. -EEG negative for seizures.  MRI negative for acute findings.  Appreciate neurology's help-recommended against starting of AEDs-neurology signed off. -Discontinue Wellbutrin -On aspiration/seizure precautions.  Acute metabolic encephalopathy: Resolved -Likely in the setting of alcohol intoxication and medication overdose?.  EEG negative for seizures.  No indication to start on AEDs as per neurology recommendations. -CT head negative for acute findings. -Ethanol level: WNL, ammonia level: Less than 9, acetaminophen level and salicylate level: WNL.  COVID-19 negative.  B12, folate: WNL, RPR: Nonreactive -Continue with aspiration/fall precautions. -PT/OT recommended home health PT/OT  AKI: -Likely in the setting of underlying infection.  AKI resolved with IV hydration.  Elevated liver enzymes: -In the setting of alcohol abuse.  Right upper quadrant ultrasound shows mild liver surface contour nodularity raising possibility of cirrhosis.  Ammonia level: WNL.  -PT/INR: WNL, acute hepatitis panel: Negative -Patient's liver enzymes-initially trended up and now improving.  We will continue to monitor her liver enzymes.  Alcohol use disorder: -Serum ethanol level: Less than 10. -Continue with CIWA protocol.  Hypokalemia: Resolved  Scalp laceration: -Sutured in ED.  Subclinical hyperthyroidism: -Repeat thyroid panel outpatient with PCP.  Hypertension: Resume home meds amlodipine and lisinopril and Toprol.  Depression/anxiety: Resume home meds-trazodone, Effexor.  Hold Wellbutrin-(risk of increased seizures) -Appreciate psychiatry's recommendations  DVT prophylaxis: Lovenox Code Status: Full code Family Communication:   None present at bedside.  Plan of care discussed with  patient in length and she verbalized understanding and agreed with it.  Disposition Plan: Likely home tomorrow  Consultants:   Neurology  Procedures:   CT head  Antimicrobials:   Rocephin  Azithromycin  Status is: Inpatient  Dispo: The patient is from: Home              Anticipated d/c is to: Home              Anticipated d/c date is: 3 days              Patient currently is not medically stable to d/c.   Subjective: Patient seen and examined.  Tells me that she feels much better this morning and wishes to go home.  She denies any new complaints.  She tells me that her mood is better but sometimes she gets angry with her friend and start drinking alcohol.  Objective: Vitals:   11/08/20 0511 11/08/20 0721 11/08/20 0925 11/08/20 1127  BP: (!) 137/93 (!) 140/92 (!) 136/92 133/87  Pulse: 93 96 98 91  Resp: 17 17  18   Temp: 97.9 F (36.6 C) 98 F (36.7 C)  98.2 F (36.8 C)  TempSrc: Oral Oral  Oral  SpO2: 97% 97%  100%  Weight: 70.6 kg     Height:        Intake/Output Summary (Last 24 hours) at 11/08/2020 1332 Last data filed at 11/08/2020 1128 Gross per 24 hour  Intake 2486.93 ml  Output 150 ml  Net 2336.93 ml   Filed Weights   11/06/20 1400 11/07/20 1507 11/08/20 0511  Weight: 70.3 kg 70.3 kg 70.6 kg    Examination:  General exam: Appears calm and comfortable, on room air, alert and oriented x3, pleasant, communicating well respiratory system: Clear to auscultation. Respiratory effort normal. Cardiovascular system: S1 & S2 heard, RRR. No JVD, murmurs, rubs, gallops or clicks. No pedal edema. Gastrointestinal system: Abdomen is nondistended, soft and nontender. No organomegaly or masses felt. Normal bowel sounds heard. Central nervous system: Alert and oriented. No focal neurological deficits. Extremities: Symmetric 5 x 5 power. Skin: No rashes, lesions or ulcers Psychiatry: Judgement and insight appear  normal. Mood & affect appropriate.    Data Reviewed: I have personally reviewed following labs and imaging studies  CBC: Recent Labs  Lab 11/05/20 2106 11/06/20 0559 11/06/20 0836 11/07/20 0459 11/07/20 0715 11/08/20 0840  WBC 21.3* 24.7* 25.1* TEST WILL BE CREDITED 20.6* 15.7*  NEUTROABS 19.1*  --   --   --   --   --   HGB 12.6 11.8* 11.8* TEST WILL BE CREDITED 12.7 11.5*  HCT 36.1 33.4* 33.4* TEST WILL BE CREDITED 36.7 34.4*  MCV 81.5 80.7 80.1 TEST WILL BE CREDITED 82.5 84.5  PLT 297 279 286 TEST WILL BE CREDITED 247 800   Basic Metabolic Panel: Recent Labs  Lab 11/05/20 2106 11/06/20 0559 11/06/20 0836 11/07/20 0459 11/08/20 0840  NA 136  --  136 142 141  K 3.2*  --  3.2* 3.1* 4.4  CL 99  --  100 102 105  CO2 22  --  24 28 27   GLUCOSE 95  --  116* 86 92  BUN 24*  --  22 13 9   CREATININE 1.60*  --  1.34* 0.95 0.91  CALCIUM 8.6*  --  8.8* 8.9 8.8*  MG  --  1.7  --  1.8  --   PHOS  --  2.7  --   --   --  GFR: Estimated Creatinine Clearance (by C-G formula based on SCr of 0.91 mg/dL) Female: 56.7 mL/min Female: 69.8 mL/min Liver Function Tests: Recent Labs  Lab 11/05/20 2106 11/06/20 0836 11/07/20 0459 11/08/20 0840  AST 144* 208* 212* 131*  ALT 73* 84* 94* 85*  ALKPHOS 128* 100 90 87  BILITOT 1.4* 1.1 1.0 1.0  PROT 6.4* 6.1* 6.1* 5.7*  ALBUMIN 3.8 3.4* 3.3* 3.2*   Recent Labs  Lab 11/05/20 2106  LIPASE 17   Recent Labs  Lab 11/06/20 0836  AMMONIA <9*   Coagulation Profile: Recent Labs  Lab 11/06/20 0836  INR 1.1   Cardiac Enzymes: No results for input(s): CKTOTAL, CKMB, CKMBINDEX, TROPONINI in the last 168 hours. BNP (last 3 results) No results for input(s): PROBNP in the last 8760 hours. HbA1C: No results for input(s): HGBA1C in the last 72 hours. CBG: No results for input(s): GLUCAP in the last 168 hours. Lipid Profile: No results for input(s): CHOL, HDL, LDLCALC, TRIG, CHOLHDL, LDLDIRECT in the last 72 hours. Thyroid Function  Tests: Recent Labs    11/06/20 0836 11/08/20 0506  TSH 0.315*  --   FREET4  --  1.34*   Anemia Panel: Recent Labs    11/06/20 0836 11/06/20 1325 11/07/20 1919  VITAMINB12  --  404  --   FOLATE 9.3  --   --   FERRITIN  --   --  140  TIBC  --   --  293  IRON  --   --  49   Sepsis Labs: Recent Labs  Lab 11/06/20 0559 11/07/20 0842 11/07/20 1201 11/08/20 0840  PROCALCITON  --   --   --  1.14  LATICACIDVEN 2.3* 1.3 2.0* 1.3    Recent Results (from the past 240 hour(s))  Blood culture (single)     Status: None (Preliminary result)   Collection Time: 11/05/20 10:04 PM   Specimen: BLOOD  Result Value Ref Range Status   Specimen Description BLOOD LEFT ANTECUBITAL  Final   Special Requests   Final    BOTTLES DRAWN AEROBIC AND ANAEROBIC Blood Culture adequate volume   Culture   Final    NO GROWTH 3 DAYS Performed at [facility], [address] [place], [place] [address]    Report Status PENDING  Incomplete  Culture, blood (Routine X 2) w Reflex to ID Panel     Status: None (Preliminary result)   Collection Time: 11/07/20  4:59 AM   Specimen: BLOOD  Result Value Ref Range Status   Specimen Description BLOOD LEFT AC  Final   Special Requests   Final    BOTTLES DRAWN AEROBIC AND ANAEROBIC Blood Culture results may not be optimal due to an excessive volume of blood received in culture bottles   Culture   Final    NO GROWTH < 24 HOURS Performed at [facility], [address] [place], [place] [address]    Report Status PENDING  Incomplete      Radiology Studies: EEG  Result Date: 11/06/2020 Lora Havens, MD     11/06/2020  3:42 PM Patient Name: Andreka Stucki MRN: [id number] Epilepsy Attending: Lora Havens Referring Physician/Provider: Dr. Judd Gaudier Date: 11/06/2020 Duration: 21.55 minutes Patient history: 65 year old female with history of alcohol abuse who presented with witnessed seizure.  EEG to evaluate for seizures. Level of  alertness: Awake AEDs during EEG study: Ativan Technical aspects: This EEG study was done with scalp electrodes positioned according to the 10-20 International system  of electrode placement. Electrical activity was acquired at a sampling rate of 500Hz  and reviewed with a high frequency filter of 70Hz  and a low frequency filter of 1Hz . EEG data were recorded continuously and digitally stored. Description: No clear posterior dominant rhythm was seen.  There is an excessive amount of 15 to 18 Hz beta activity  distributed symmetrically and diffusely. Hyperventilation and photic stimulation were not performed.   ABNORMALITY -Excessive beta, generalized IMPRESSION: This study is within normal limits. No seizures or epileptiform discharges were seen throughout the recording. The excessive beta activity seen in the background is most likely due to the effect of benzodiazepine and is a benign EEG pattern. Lora Havens   MR BRAIN WO CONTRAST  Result Date: 11/06/2020 CLINICAL DATA:  Seizure EXAM: MRI HEAD WITHOUT CONTRAST TECHNIQUE: Multiplanar, multiecho pulse sequences of the brain and surrounding structures were obtained without intravenous contrast. COMPARISON:  CT head 11/05/2020 FINDINGS: Brain: Image quality degraded by moderate to extensive motion which became progressive as the study progressed. No acute infarct on diffusion-weighted imaging. Mild white matter changes. Negative for mass or fluid collection. Vascular: Normal arterial flow voids Skull and upper cervical spine: Negative Sinuses/Orbits: Paranasal sinuses clear.  Negative orbit Other: None IMPRESSION: Image quality degraded by significant motion. No acute abnormality identified. Electronically Signed   By: Franchot Gallo M.D.   On: 11/06/2020 17:12    Scheduled Meds: . amLODipine  10 mg Oral Daily  . cholecalciferol  3,000 Units Oral Daily  . enoxaparin (LOVENOX) injection  40 mg Subcutaneous Q24H  . feeding supplement  237 mL Oral BID BM   . folic acid  1 mg Oral Daily  . lisinopril  40 mg Oral Daily  . [START ON 11/09/2020] LORazepam  0-4 mg Oral Q12H  . multivitamin with minerals  1 tablet Oral Daily  . potassium chloride  40 mEq Oral BID  . thiamine  100 mg Oral Daily   Or  . thiamine  100 mg Intravenous Daily  . venlafaxine XR  225 mg Oral Q breakfast   Continuous Infusions: . sodium chloride 100 mL/hr at 11/07/20 1833  . azithromycin 500 mg (11/08/20 0010)  . cefTRIAXone (ROCEPHIN)  IV 2 g (11/07/20 1839)     LOS: 2 days   Time spent: 40 minutes.   Mckinley Jewel, MD [facility]  If 7PM-7AM, please contact night-coverage [email or web address] 11/08/2020, 1:32 PM

## 2020-11-08 NOTE — Care Management Important Message (Signed)
Important Message  Patient Details  Name: Caroline Serrano MRN: [id number] Date of Birth: 11/28/1955   Medicare Important Message Given:  Yes     Johnell Comings 11/08/2020, 1:21 PM

## 2020-11-08 NOTE — Progress Notes (Signed)
[facility] MD Progress Note  11/08/2020 6:35 PM Caroline Serrano  MRN:  [id number] Subjective: Follow-up consult for patient with alcohol withdrawal and depression.  Patient reports that she is feeling emotionally better today.  Not feeling sad or hopeless.  Denies suicidal thought.  Articulates positive plans for the future.  I asked her directly whether any of her mood problems could be related to frustration at not being a good candidate for transgender surgery or concerns overall about her transgender and sexual identity.  Patient agreed that that is a chronic stress and was willing to open up and talk about it for a while.  Seems lucid not aggressive no suicidal threats. Principal Problem: Alcohol withdrawal seizure with complication, with unspecified complication (HCC) Diagnosis: Principal Problem:   Alcohol withdrawal seizure with complication, with unspecified complication (HCC) Active Problems:   Alcohol use disorder, moderate, dependence (HCC)   Acute metabolic encephalopathy   Elevated liver enzymes   Sepsis (HCC)   Seizure (HCC)   HTN (hypertension)   Anxiety   Scalp laceration, initial encounter   Severe recurrent major depression without psychotic features (HCC)  Total Time spent with patient: 30 minutes  Past Psychiatric History: Past history of longstanding alcohol abuse.  Depression on multiple medicine  Past Medical History:  Past Medical History:  Diagnosis Date  . Anxiety   . Depression     Past Surgical History:  Procedure Laterality Date  . GASTRIC BYPASS     Family History:  Family History  Problem Relation Age of Onset  . Hypertension Mother   . Stroke Mother   . Diabetes Mother   . Hypertension Father   . Diabetes Paternal Grandmother    Family Psychiatric  History: She previous. Social History:  Social History   Substance and Sexual Activity  Alcohol Use Yes   Comment: only occasional     Social History   Substance and Sexual Activity   Drug Use No    Social History   Socioeconomic History  . Marital status: Divorced    Spouse name: Not on file  . Number of children: Not on file  . Years of education: Not on file  . Highest education level: Not on file  Occupational History  . Not on file  Tobacco Use  . Smoking status: Never Smoker  . Smokeless tobacco: Never Used  Vaping Use  . Vaping Use: Never used  Substance and Sexual Activity  . Alcohol use: Yes    Comment: only occasional  . Drug use: No  . Sexual activity: Not on file  Other Topics Concern  . Not on file  Social History Narrative  . Not on file   Social Determinants of Health   Financial Resource Strain: Not on file  Food Insecurity: Not on file  Transportation Needs: Not on file  Physical Activity: Not on file  Stress: Not on file  Social Connections: Not on file   Additional Social History:                         Sleep: Fair  Appetite:  Fair  Current Medications: Current Facility-Administered Medications  Medication Dose Route Frequency Provider Last Rate Last Admin  . acetaminophen (TYLENOL) tablet 650 mg  650 mg Oral Q4H PRN Andris Baumann, MD   650 mg at 11/06/20 1350   Or  . acetaminophen (TYLENOL) suppository 650 mg  650 mg Rectal Q4H PRN Andris Baumann, MD      .  amLODipine (NORVASC) tablet 10 mg  10 mg Oral Daily Pahwani, Rinka R, MD   10 mg at 11/08/20 0927  . cholecalciferol (VITAMIN D3) tablet 3,000 Units  3,000 Units Oral Daily Pahwani, Rinka R, MD   3,000 Units at 11/08/20 1443  . enoxaparin (LOVENOX) injection 40 mg  40 mg Subcutaneous Q24H Lindajo Royaluncan, Hazel V, MD   40 mg at 11/08/20 1443  . feeding supplement (ENSURE ENLIVE / ENSURE PLUS) liquid 237 mL  237 mL Oral BID BM Pahwani, Rinka R, MD      . folic acid (FOLVITE) tablet 1 mg  1 mg Oral Daily Andris Baumannuncan, Hazel V, MD   1 mg at 11/08/20 0925  . lisinopril (ZESTRIL) tablet 40 mg  40 mg Oral Daily Pahwani, Rinka R, MD   40 mg at 11/08/20 0927  . loperamide  (IMODIUM) capsule 2 mg  2 mg Oral PRN Pahwani, Rinka R, MD   2 mg at 11/08/20 1046  . LORazepam (ATIVAN) tablet 1-4 mg  1-4 mg Oral Q1H PRN Pahwani, Kasandra Knudseninka R, MD       Or  . LORazepam (ATIVAN) injection 1-4 mg  1-4 mg Intravenous Q1H PRN Pahwani, Rinka R, MD   2 mg at 11/07/20 2223  . [START ON 11/09/2020] LORazepam (ATIVAN) tablet 0-4 mg  0-4 mg Oral Q12H Pahwani, Rinka R, MD      . metoprolol succinate (TOPROL-XL) 24 hr tablet 25 mg  25 mg Oral Daily Pahwani, Rinka R, MD   25 mg at 11/08/20 1656  . multivitamin with minerals tablet 1 tablet  1 tablet Oral Daily Andris Baumannuncan, Hazel V, MD   1 tablet at 11/08/20 0925  . ondansetron (ZOFRAN) tablet 4 mg  4 mg Oral Q6H PRN Andris Baumannuncan, Hazel V, MD       Or  . ondansetron [facility](ZOFRAN) injection 4 mg  4 mg Intravenous Q6H PRN Andris Baumannuncan, Hazel V, MD      . potassium chloride (KLOR-CON) packet 40 mEq  40 mEq Oral BID Pahwani, Rinka R, MD   40 mEq at 11/08/20 0925  . thiamine tablet 100 mg  100 mg Oral Daily Sharman CheekStafford, Phillip, MD   100 mg at 11/08/20 16100925   Or  . thiamine (B-1) injection 100 mg  100 mg Intravenous Daily Sharman CheekStafford, Phillip, MD   100 mg at 11/06/20 1020  . traZODone (DESYREL) tablet 100 mg  100 mg Oral QHS PRN Pahwani, Rinka R, MD   100 mg at 11/07/20 2226  . venlafaxine XR (EFFEXOR-XR) 24 hr capsule 225 mg  225 mg Oral Q breakfast Pahwani, Rinka R, MD   225 mg at 11/08/20 1047    Lab Results:  Results for orders placed or performed during the hospital encounter of 11/05/20 (from the past 48 hour(s))  CBC     Status: None   Collection Time: 11/07/20  4:59 AM  Result Value Ref Range   WBC TEST WILL BE CREDITED 4.0 - 10.5 K/uL   RBC TEST WILL BE CREDITED 3.87 - 5.11 MIL/uL   Hemoglobin TEST WILL BE CREDITED 12.0 - 15.0 g/dL   HCT TEST WILL BE CREDITED 36.0 - 46.0 %   MCV TEST WILL BE CREDITED 80.0 - 100.0 fL   MCH TEST WILL BE CREDITED 26.0 - 34.0 pg   MCHC TEST WILL BE CREDITED 30.0 - 36.0 g/dL   RDW TEST WILL BE CREDITED 11.5 - 15.5 %   Platelets  TEST WILL BE CREDITED 150 - 400 K/uL   nRBC TEST  WILL BE CREDITED 0.0 - 0.2 %    Comment: Performed at [facility], [address] [place], [place] [address]  Comprehensive metabolic panel     Status: Abnormal   Collection Time: 11/07/20  4:59 AM  Result Value Ref Range   Sodium 142 135 - 145 mmol/L   Potassium 3.1 (L) 3.5 - 5.1 mmol/L   Chloride 102 98 - 111 mmol/L   CO2 28 22 - 32 mmol/L   Glucose, Bld 86 70 - 99 mg/dL    Comment: Glucose reference range applies only to samples taken after fasting for at least 8 hours.   BUN 13 8 - 23 mg/dL   Creatinine, Ser 7.62 0.44 - 1.00 mg/dL   Calcium 8.9 8.9 - 83.1 mg/dL   Total Protein 6.1 (L) 6.5 - 8.1 g/dL   Albumin 3.3 (L) 3.5 - 5.0 g/dL   AST 517 (H) 15 - 41 U/L   ALT 94 (H) 0 - 44 U/L   Alkaline Phosphatase 90 38 - 126 U/L   Total Bilirubin 1.0 0.3 - 1.2 mg/dL   GFR, Estimated >61 >60 mL/min    Comment: (NOTE) Calculated using the CKD-EPI Creatinine Equation (2021)    Anion gap 12 5 - 15    Comment: Performed at [facility], [address] [place], [place] [address]  Magnesium     Status: None   Collection Time: 11/07/20  4:59 AM  Result Value Ref Range   Magnesium 1.8 1.7 - 2.4 mg/dL    Comment: Performed at [facility], [address], [place], [place] [address]  Culture, blood (Routine X 2) w Reflex to ID Panel     Status: None (Preliminary result)   Collection Time: 11/07/20  4:59 AM   Specimen: BLOOD  Result Value Ref Range   Specimen Description BLOOD LEFT AC    Special Requests      BOTTLES DRAWN AEROBIC AND ANAEROBIC Blood Culture results may not be optimal due to an excessive volume of blood received in culture bottles   Culture      NO GROWTH < 24 HOURS Performed at [facility], [address] [place], [place] [address]    Report Status PENDING   CBC     Status: Abnormal   Collection Time: 11/07/20  7:15 AM  Result Value Ref Range   WBC 20.6 (H) 4.0 - 10.5 K/uL    RBC 4.45 3.87 - 5.11 MIL/uL   Hemoglobin 12.7 12.0 - 15.0 g/dL   HCT 70.3 50.0 - 93.8 %   MCV 82.5 80.0 - 100.0 fL   MCH 28.5 26.0 - 34.0 pg   MCHC 34.6 30.0 - 36.0 g/dL   RDW 18.2 99.3 - 71.6 %   Platelets 247 150 - 400 K/uL   nRBC 0.0 0.0 - 0.2 %    Comment: Performed at [facility], [address] [place], [place] [address]  Lactic acid, plasma     Status: None   Collection Time: 11/07/20  8:42 AM  Result Value Ref Range   Lactic Acid, Venous 1.3 0.5 - 1.9 mmol/L    Comment: Performed at [facility], [address] [place], [place] [address]  Lactic acid, plasma     Status: Abnormal   Collection Time: 11/07/20 12:01 PM  Result Value Ref Range   Lactic Acid, Venous 2.0 (HH) 0.5 - 1.9 mmol/L    Comment: CRITICAL VALUE NOTED. VALUE IS CONSISTENT WITH PREVIOUSLY REPORTED/CALLED VALUE/HKP Performed  at [facility], [address] [place], [place] [address]   Iron and TIBC     Status: None   Collection Time: 11/07/20  7:19 PM  Result Value Ref Range   Iron 49 28 - 170 ug/dL   TIBC 293 250 - 450 ug/dL   Saturation Ratios 17 10.4 - 31.8 %   UIBC 244 ug/dL    Comment: Performed at [facility], [address] [place], [place] [address]  Transferrin     Status: None   Collection Time: 11/07/20  7:19 PM  Result Value Ref Range   Transferrin 212 192 - 382 mg/dL    Comment: Performed at [facility], [address] [place], [place] [address]  Ferritin     Status: None   Collection Time: 11/07/20  7:19 PM  Result Value Ref Range   Ferritin 140 11 - 307 ng/mL    Comment: Performed at [facility], [address] [place], [place] [address]  VITAMIN D 25 Hydroxy (Vit-D Deficiency, Fractures)     Status: Abnormal   Collection Time: 11/07/20  7:19 PM  Result Value Ref Range   Vit D, 25-Hydroxy 12.58 (L) 30 - 100 ng/mL    Comment: (NOTE) Vitamin D deficiency has been defined by the [place] practice guideline as a level of serum 25-OH  vitamin D less than 20 ng/mL (1,2). The Endocrine Society went on to  further define vitamin D insufficiency as a level between 21 and 29  ng/mL (2).  1. IOM (Institute of Medicine). 2010. Dietary reference intakes for  calcium and D. Louise: The [employer]. 2. Holick MF, Binkley Gifford, Bischoff-Ferrari HA, et al. Evaluation,  treatment, and prevention of vitamin D deficiency: an Endocrine  Society clinical practice guideline, JCEM. 2011 Jul; 96(7): 1911-30.  Performed at [facility], [place] [address] [place], [place] [address]   T4, free     Status: Abnormal   Collection Time: 11/08/20  5:06 AM  Result Value Ref Range   Free T4 1.34 (H) 0.61 - 1.12 ng/dL    Comment: (NOTE) Biotin ingestion may interfere with free T4 tests. If the results are inconsistent with the TSH level, previous test results, or the clinical presentation, then consider biotin interference. If needed, order repeat testing after stopping biotin. Performed at [facility], [address] [place], [place] [address]   CBC     Status: Abnormal   Collection Time: 11/08/20  8:40 AM  Result Value Ref Range   WBC 15.7 (H) 4.0 - 10.5 K/uL   RBC 4.07 3.87 - 5.11 MIL/uL   Hemoglobin 11.5 (L) 12.0 - 15.0 g/dL   HCT 34.4 (L) 36.0 - 46.0 %   MCV 84.5 80.0 - 100.0 fL   MCH 28.3 26.0 - 34.0 pg   MCHC 33.4 30.0 - 36.0 g/dL   RDW 14.1 11.5 - 15.5 %   Platelets 274 150 - 400 K/uL   nRBC 0.1 0.0 - 0.2 %    Comment: Performed at [facility], [address] [place],  [address]  Comprehensive metabolic panel     Status: Abnormal   Collection Time: 11/08/20  8:40 AM  Result Value Ref Range   Sodium 141 135 - 145 mmol/L   Potassium 4.4 3.5 - 5.1 mmol/L   Chloride 105 98 - 111 mmol/L   CO2 27 22 - 32 mmol/L   Glucose, Bld  92 70 - 99 mg/dL    Comment: Glucose reference range applies only to samples taken after fasting  for at least 8 hours.   BUN 9 8 - 23 mg/dL   Creatinine, Ser 5.00 0.44 - 1.00 mg/dL   Calcium 8.8 (L) 8.9 - 10.3 mg/dL   Total Protein 5.7 (L) 6.5 - 8.1 g/dL   Albumin 3.2 (L) 3.5 - 5.0 g/dL   AST 938 (H) 15 - 41 U/L   ALT 85 (H) 0 - 44 U/L   Alkaline Phosphatase 87 38 - 126 U/L   Total Bilirubin 1.0 0.3 - 1.2 mg/dL   GFR, Estimated >18 >29 mL/min    Comment: (NOTE) Calculated using the CKD-EPI Creatinine Equation (2021)    Anion gap 9 5 - 15    Comment: Performed at [facility], [address] [place], [place] [address]  Lactic acid, plasma     Status: None   Collection Time: 11/08/20  8:40 AM  Result Value Ref Range   Lactic Acid, Venous 1.3 0.5 - 1.9 mmol/L    Comment: Performed at [facility], [address] [place], [place] [address]  Procalcitonin - Baseline     Status: None   Collection Time: 11/08/20  8:40 AM  Result Value Ref Range   Procalcitonin 1.14 ng/mL    Comment:        Interpretation: PCT > 0.5 ng/mL and <= 2 ng/mL: Systemic infection (sepsis) is possible, but other conditions are known to elevate PCT as well. (NOTE)       Sepsis PCT Algorithm           Lower Respiratory Tract                                      Infection PCT Algorithm    ----------------------------     ----------------------------         PCT < 0.25 ng/mL                PCT < 0.10 ng/mL          Strongly encourage             Strongly discourage   discontinuation of antibiotics    initiation of antibiotics    ----------------------------     -----------------------------       PCT 0.25 - 0.50 ng/mL            PCT 0.10 - 0.25 ng/mL               OR       >80% decrease in PCT            Discourage initiation of                                            antibiotics      Encourage discontinuation           of antibiotics    ----------------------------     -----------------------------         PCT >= 0.50 ng/mL              PCT 0.26 - 0.50 ng/mL                 AND       <  80% decrease in PCT             Encourage initiation of                                             antibiotics       Encourage continuation           of antibiotics    ----------------------------     -----------------------------        PCT >= 0.50 ng/mL                  PCT > 0.50 ng/mL               AND         increase in PCT                  Strongly encourage                                      initiation of antibiotics    Strongly encourage escalation           of antibiotics                                     -----------------------------                                           PCT <= 0.25 ng/mL                                                 OR                                        > 80% decrease in PCT                                      Discontinue / Do not initiate                                             antibiotics  Performed at [facility], [address] [place], [place] [address]   Lactic acid, plasma     Status: Abnormal   Collection Time: 11/08/20  1:12 PM  Result Value Ref Range   Lactic Acid, Venous 3.1 (HH) 0.5 - 1.9 mmol/L    Comment: CRITICAL VALUE NOTED. VALUE IS CONSISTENT WITH PREVIOUSLY REPORTED/CALLED VALUE DAS Performed at [facility], [address] [place], [place] [address]     Blood Alcohol level:  Lab Results  Component Value Date   [facility]    Metabolic Disorder Labs: No results found for: HGBA1C, MPG No results found  for: PROLACTIN No results found for: CHOL, TRIG, HDL, CHOLHDL, VLDL, LDLCALC  Physical Findings: AIMS:  , ,  ,  ,    CIWA:  CIWA-Ar Total: 2 COWS:     Musculoskeletal: Strength & Muscle Tone: within normal limits Gait & Station: normal Patient leans: N/A  Psychiatric Specialty Exam: Physical Exam Vitals and nursing note reviewed.  Constitutional:      Appearance: She is well-developed and well-nourished.  HENT:     Head: Normocephalic and atraumatic.   Eyes:     Conjunctiva/sclera: Conjunctivae normal.     Pupils: Pupils are equal, round, and reactive to light.  Cardiovascular:     Heart sounds: Normal heart sounds.  Pulmonary:     Effort: Pulmonary effort is normal.  Abdominal:     Palpations: Abdomen is soft.  Musculoskeletal:        General: Normal range of motion.     Cervical back: Normal range of motion.  Skin:    General: Skin is warm and dry.  Neurological:     General: No focal deficit present.     Mental Status: She is alert.  Psychiatric:        Attention and Perception: Attention normal.        Mood and Affect: Mood normal.        Speech: Speech normal.        Behavior: Behavior normal.        Thought Content: Thought content normal.        Cognition and Memory: Cognition normal.        Judgment: Judgment normal.     Review of Systems  Constitutional: Negative.   HENT: Negative.   Eyes: Negative.   Respiratory: Negative.   Cardiovascular: Negative.   Gastrointestinal: Negative.   Musculoskeletal: Negative.   Skin: Negative.   Neurological: Negative.   Psychiatric/Behavioral: Negative.     Blood pressure 117/83, pulse 93, temperature 97.8 F (36.6 C), temperature source Oral, resp. rate 18, height 5\' 2"  (1.575 m), weight 70.6 kg, SpO2 98 %.Body mass index is 28.47 kg/m.  General Appearance: Casual  Eye Contact:  Fair  Speech:  Normal Rate  Volume:  Normal  Mood:  Euthymic  Affect:  Congruent  Thought Process:  Goal Directed  Orientation:  Full (Time, Place, and Person)  Thought Content:  Logical  Suicidal Thoughts:  No  Homicidal Thoughts:  No  Memory:  Immediate;   Fair Recent;   Fair Remote;   Fair  Judgement:  Fair  Insight:  Fair  Psychomotor Activity:  Normal  Concentration:  Concentration: Fair  Recall:  of Knowledge:  Fair  Language:  Fair  Akathisia:  No  Handed:  Right  AIMS (if indicated):     Assets:  Desire for Improvement Housing Social Support  ADL's:  Intact   Cognition:  WNL  Sleep:        Treatment Plan Summary: Medication management and Plan Patient at this point is not voicing suicidal ideation and her behavior has been calm.  I did some counseling with her about alcohol abuse emphasizing the need to cut back on her drinking.  As far as her medication I think continuing the Effexor is fine and in fact I will go ahead and restart the Wellbutrin but I recommend a lower dose of 300 mg.  Patient does not require inpatient psychiatric treatment at this time.  We will follow-up just as needed.  [employer], MD 11/08/2020, 6:35 PM

## 2020-11-08 NOTE — Consult Note (Signed)
MEDICATION RELATED CONSULT NOTE - INITIAL   Pharmacy Consult for drug interaction and monitoring of AEDs   Allergies  Allergen Reactions  . Aspirin Other (See Comments)    Gastric Bypass  . Ibuprofen Other (See Comments)    Gastric bypass   Medical History: Past Medical History:  Diagnosis Date  . Anxiety   . Depression    Medications:  Scheduled:  . amLODipine  10 mg Oral Daily  . cholecalciferol  3,000 Units Oral Daily  . enoxaparin (LOVENOX) injection  40 mg Subcutaneous Q24H  . feeding supplement  237 mL Oral BID BM  . folic acid  1 mg Oral Daily  . lisinopril  40 mg Oral Daily  . [START ON 11/09/2020] LORazepam  0-4 mg Oral Q12H  . metoprolol succinate  25 mg Oral Daily  . multivitamin with minerals  1 tablet Oral Daily  . potassium chloride  40 mEq Oral BID  . thiamine  100 mg Oral Daily   Or  . thiamine  100 mg Intravenous Daily  . venlafaxine XR  225 mg Oral Q breakfast   Assessment: 65 year old female presenting with altered mental status after being found on floor by roommate with jerking movements. PMH includes HTN, anxiety, and alcohol abuse. Pt drinks up to 24 beers a day but had less than usual. Pt does not have a history of seizures. Pt admitted with sepsis and seizure-like activity likely from alcohol withdrawal. Pharmacy has been consulted for drug interaction and monitoring of AEDs.  Plan:  Pt is not currently on any AEDs. Pt on lorazepam for CIWA protocol. Will continue to monitor for addition of AEDs.   Reatha Armour, PharmD Pharmacy Resident  11/08/2020 2:04 PM

## 2020-11-09 DIAGNOSIS — F10239 Alcohol dependence with withdrawal, unspecified: Secondary | ICD-10-CM | POA: Diagnosis not present

## 2020-11-09 LAB — CBC
HCT: 33.8 % — ABNORMAL LOW (ref 36.0–46.0)
Hemoglobin: 11.5 g/dL — ABNORMAL LOW (ref 12.0–15.0)
MCH: 28.4 pg (ref 26.0–34.0)
MCHC: 34 g/dL (ref 30.0–36.0)
MCV: 83.5 fL (ref 80.0–100.0)
Platelets: 273 10*3/uL (ref 150–400)
RBC: 4.05 MIL/uL (ref 3.87–5.11)
RDW: 14.2 % (ref 11.5–15.5)
WBC: 10.9 10*3/uL — ABNORMAL HIGH (ref 4.0–10.5)
nRBC: 0 % (ref 0.0–0.2)

## 2020-11-09 LAB — PROCALCITONIN: Procalcitonin: 0.64 ng/mL

## 2020-11-09 LAB — COMPREHENSIVE METABOLIC PANEL
ALT: 84 U/L — ABNORMAL HIGH (ref 0–44)
AST: 136 U/L — ABNORMAL HIGH (ref 15–41)
Albumin: 3.1 g/dL — ABNORMAL LOW (ref 3.5–5.0)
Alkaline Phosphatase: 85 U/L (ref 38–126)
Anion gap: 8 (ref 5–15)
BUN: 12 mg/dL (ref 8–23)
CO2: 26 mmol/L (ref 22–32)
Calcium: 9 mg/dL (ref 8.9–10.3)
Chloride: 104 mmol/L (ref 98–111)
Creatinine, Ser: 0.97 mg/dL (ref 0.44–1.00)
GFR, Estimated: >60 mL/min (ref >60)
Glucose, Bld: 93 mg/dL (ref 70–99)
Potassium: 4.4 mmol/L (ref 3.5–5.1)
Sodium: 138 mmol/L (ref 135–145)
Total Bilirubin: 0.8 mg/dL (ref 0.3–1.2)
Total Protein: 5.6 g/dL — ABNORMAL LOW (ref 6.5–8.1)

## 2020-11-09 LAB — LACTIC ACID, PLASMA
Lactic Acid, Venous: 0.8 mmol/L (ref 0.5–1.9)
Lactic Acid, Venous: 2.6 mmol/L (ref 0.5–1.9)

## 2020-11-09 MED ORDER — ADULT MULTIVITAMIN W/MINERALS CH: 1.0000 | ORAL_TABLET | Freq: Every day | ORAL | 0 refills | Status: AC

## 2020-11-09 MED ORDER — FOLIC ACID 1 MG PO TABS
1.0000 mg | ORAL_TABLET | Freq: Every day | ORAL | 0 refills | Status: AC
Start: 2020-11-10 — End: ?

## 2020-11-09 MED ORDER — THIAMINE HCL 100 MG PO TABS: 100.0000 mg | ORAL_TABLET | Freq: Every day | ORAL | 0 refills | Status: AC

## 2020-11-09 NOTE — Discharge Summary (Addendum)
Physician Discharge Summary  Caroline Serrano TDV:[id number] DOB: [birth year] DOA: 11/05/2020  PCP: Freddy Finner, NP  Admit date: 11/05/2020 Discharge date: 11/09/2020  Admitted From: Home  disposition: Home  Recommendations for Outpatient Follow-up:  Follow-up with PCP in 1 week Follow-up with psychiatry in 1 week Repeat CBC and CMP on follow-up visit Advised to avoid drinking alcohol. Take Wellbutrin 300 mg daily instead of 450 mg daily.  Home Health: Home health PT Equipment/Devices: Rolling walker Discharge Condition: Stable CODE STATUS: Full code Diet recommendation: Heart healthy diet  Brief/Interim Summary: Caroline Serrano a 65 y.o.adultwith medical history significant forhypertension, anxiety and alcohol abuse who was brought in after her roommate found her on the floor at home with jerking movements. Patient is unable to contribute to history due to altered mental status. Most of the history is taken from ED provider. Patient usually drinks up to 24 beers a day but had less than her usual. She has no prior history of seizures. ED Course:On arrival, blood pressure 190/154, pulse 109 respirations 25, O2 sat 100% on room air. Blood work significant for leukocytosis of 21,000 with lactic acid of 3.7. CMP showed creatinine of 1.6, with elevated LFTs, AST 144, ALT 73, alk phos 128, total bili 1.4. Potassium 3.2. Alcohol level less than 10, acetaminophen and salicylate levels undetectable. Urinalysis pending Imaging:Chest x-ray showed no active disease. CT head showed no acute intracranial abnormality. Patient received empiric antibiotics in the ER for possible infection. Laceration of scalp repaired in the ER. Hospitalist consulted for admission.  SIRS: -Unknown underlying etiology?    Patient met SIRS criteria upon admission- presented with tachycardia, tachypnea, altered mental status, AKI with leukocytosis of 21,000 and lactic acid of 3.7. -UA  negative for infection.  Patient started on IV fluids, Rocephin and azithromycin in ED for possible aspiration pneumonia -Chest x-ray negative for pneumonia, right upper quadrant ultrasound negative for acute cholecystitis.  Blood culture: Negative to date-Sepsis ruled out & IV fluids and IV antibiotics discontinued on 11/08/2020. -Remained afebrile.  Leukocytosis trended down from 25,000-20,000-15,000-10.9.  Lactic acid trended down to normal    Seizure-like activity?-Likely in the setting of alcohol intoxication/withdrawal: -Patient presented with jerking movements with no prior history of seizure.  Serum alcohol level: Less than 10.  CT head negative for acute findings. -UDS is positive for phencyclidine however patient declined use of any illicit drugs-could be false positive -Continued Ativan as needed for seizure. -EEG negative for seizures.  MRI negative for acute findings.  Appreciate neurology's help-recommended against starting of AEDs-neurology signed off. -On aspiration/seizure precautions.  Acute metabolic encephalopathy: Resolved -Likely in the setting of alcohol intoxication and medication overdose?.  EEG negative for seizures.  No indication to start on AEDs as per neurology recommendations. -CT head negative for acute findings. -Ethanol level: WNL, ammonia level: Less than 9, acetaminophen level and salicylate level: WNL.  COVID-19 negative.  B12, folate: WNL, RPR: Nonreactive -Continued with aspiration/fall precautions. -PT/OT recommended home health PT/OT  Major Depression/anxiety/Mood disorder:  -Patient took handful of anxiety medications prior to arrival to the ED.  Psychiatry consulted- Resume home meds-trazodone, Effexor.    Initially Wellbutrin held given increased risk of seizures. -Patient will be discharged on Wellbutrin 300 mg daily instead of 450 mg (home dose) as per psychiatry recommendations.  No indication of inpatient psych admission at this time.-Appreciate  psychiatry's recommendations. -Advised patient and her friend to follow-up closely with patient's psychiatry.  Elevated liver enzymes: -In the setting of alcohol abuse.  Right upper  quadrant ultrasound shows mild liver surface contour nodularity raising possibility of cirrhosis.  Ammonia level: WNL.  -PT/INR: WNL, acute hepatitis panel: Negative -Patient's liver enzymes-initially trended up and now improving.  -Discussed to avoid alcohol.  Follow-up with PCP in 1 week and repeat CMP on follow-up visit.  Alcohol use disorder: -Serum ethanol level: Less than 10. -Continued with CIWA protocol.  Patient alert and oriented and very pleasant on the day of discharge. -Discharged home on folic acid, thiamine and multivitamins.  Hypokalemia: Resolved  Scalp laceration: -Sutured in ED.  Subclinical hyperthyroidism: -Repeat thyroid panel outpatient with PCP.  Hypertension: Resumed home meds amlodipine and lisinopril and Toprol.  AKI: -AKI resolved with IV hydration.  Patient will be discharged home in stable condition.  She denies active suicidal or homicidal thoughts.  Plan of care discussed with patient and her friend over the phone and they both verbalized understanding.  Discharge Diagnoses:  Sepsis of unknown underlying etiology Seizure-like activity likely in the setting of frequent intoxication/withdrawal Acute metabolic encephalopathy AKI Elevated liver enzymes Alcohol use disorder Hypokalemia Scalp laceration Subclinical hypothyroidism Hypertension Depression/anxiety   Discharge Instructions  Discharge Instructions    Discharge instructions   Complete by: As directed    Follow-up with PCP in 1 week Follow-up with psychiatry in 1 week Repeat CBC and CMP on follow-up visit Advised to avoid drinking alcohol. Take Wellbutrin 300 mg daily instead of 450 mg daily.   Increase activity slowly   Complete by: As directed      Allergies as of 11/09/2020      Reactions    Aspirin Other (See Comments)   Gastric Bypass   Ibuprofen Other (See Comments)   Gastric bypass      Medication List    TAKE these medications   amLODipine 10 MG tablet Commonly known as: NORVASC Take 10 mg by mouth daily.   aspirin EC 81 MG tablet Take 81 mg by mouth daily.   Biotin 1 MG Caps Take 5 mg by mouth 2 (two) times daily.   buPROPion 300 MG 24 hr tablet Commonly known as: WELLBUTRIN XL Take 300 mg by mouth daily. (for a total of 450 mg) What changed: Another medication with the same name was removed. Continue taking this medication, and follow the directions you see here.   chlorthalidone 25 MG tablet Commonly known as: HYGROTON Take 50 mg by mouth daily.   cyanocobalamin 1000 MCG tablet Take 5,000 mcg by mouth daily.   cyanocobalamin 1000 MCG/ML injection Commonly known as: (VITAMIN B-12) Inject 1 ml intramuscularly once a month   ferrous sulfate 325 (65 FE) MG EC tablet Take 1 tablet by mouth daily.   folic acid 1 MG tablet Commonly known as: FOLVITE Take 1 tablet (1 mg total) by mouth daily. Start taking on: November 10, 2020   hydrOXYzine 10 MG tablet Commonly known as: ATARAX/VISTARIL Take 10 mg by mouth 3 (three) times daily as needed.   lisinopril 40 MG tablet Commonly known as: ZESTRIL Take 40 mg by mouth daily.   metoprolol succinate 25 MG 24 hr tablet Commonly known as: TOPROL-XL Take 25 mg by mouth daily.   multivitamin with minerals Tabs tablet Take 1 tablet by mouth daily.   testosterone cypionate 200 MG/ML injection Commonly known as: DEPOTESTOSTERONE CYPIONATE Inject 6 mg as directed once a week.   thiamine 100 MG tablet Take 1 tablet (100 mg total) by mouth daily. Start taking on: November 10, 2020   traZODone 100 MG tablet Commonly known  as: DESYREL Take 100 mg by mouth at bedtime as needed for sleep.   venlafaxine XR 75 MG 24 hr capsule Commonly known as: EFFEXOR-XR Take 225 mg by mouth daily with breakfast.        Allergies  Allergen Reactions  . Aspirin Other (See Comments)    Gastric Bypass  . Ibuprofen Other (See Comments)    Gastric bypass    Consultations:  Neurology  Psychiatry   Procedures/Studies: EEG  Result Date: 11/06/2020 Lora Havens, MD     11/06/2020  3:42 PM Patient Name: Kyndle Schlender MRN: [id number] Epilepsy Attending: Lora Havens Referring Physician/Provider: Dr. Judd Gaudier Date: 11/06/2020 Duration: 21.55 minutes Patient history: 65 year old female with history of alcohol abuse who presented with witnessed seizure.  EEG to evaluate for seizures. Level of alertness: Awake AEDs during EEG study: Ativan Technical aspects: This EEG study was done with scalp electrodes positioned according to the 10-20 International system of electrode placement. Electrical activity was acquired at a sampling rate of _0  and reviewed with a high frequency filter of _1  and a low frequency filter of _2 . EEG data were recorded continuously and digitally stored. Description: No clear posterior dominant rhythm was seen.  There is an excessive amount of 15 to 18 Hz beta activity  distributed symmetrically and diffusely. Hyperventilation and photic stimulation were not performed.   ABNORMALITY -Excessive beta, generalized IMPRESSION: This study is within normal limits. No seizures or epileptiform discharges were seen throughout the recording. The excessive beta activity seen in the background is most likely due to the effect of benzodiazepine and is a benign EEG pattern. Lora Havens   DG Chest 1 View  Result Date: 11/05/2020 CLINICAL DATA:  Altered mental status, intoxication EXAM: CHEST  1 VIEW COMPARISON:  None. FINDINGS: Normal heart size. Normal mediastinal contour. No pneumothorax. No pleural effusion. Lungs appear clear, with no acute consolidative airspace disease and no pulmonary edema. IMPRESSION: No active disease. Electronically Signed   By: Ilona Sorrel M.D.   On:  11/05/2020 20:07   CT HEAD WO CONTRAST  Result Date: 11/05/2020 CLINICAL DATA:  Nonverbal jerking motion EXAM: CT HEAD WITHOUT CONTRAST TECHNIQUE: Contiguous axial images were obtained from the base of the skull through the vertex without intravenous contrast. COMPARISON:  None. FINDINGS: Brain: No acute territorial infarction, hemorrhage or intracranial mass. Mild to moderate atrophy. Nonenlarged ventricles. Vascular: No hyperdense vessels.  Carotid vascular calcification Skull: Normal. Negative for fracture or focal lesion. Sinuses/Orbits: No acute finding. Other: Mild right forehead scalp edema. Incomplete union posterior arch C1. Hypoplastic appearing right mandibular head. IMPRESSION: 1. No CT evidence for acute intracranial abnormality. 2. Atrophy. Electronically Signed   By: Donavan Foil M.D.   On: 11/05/2020 20:36   MR BRAIN WO CONTRAST  Result Date: 11/06/2020 CLINICAL DATA:  Seizure EXAM: MRI HEAD WITHOUT CONTRAST TECHNIQUE: Multiplanar, multiecho pulse sequences of the brain and surrounding structures were obtained without intravenous contrast. COMPARISON:  CT head 11/05/2020 FINDINGS: Brain: Image quality degraded by moderate to extensive motion which became progressive as the study progressed. No acute infarct on diffusion-weighted imaging. Mild white matter changes. Negative for mass or fluid collection. Vascular: Normal arterial flow voids Skull and upper cervical spine: Negative Sinuses/Orbits: Paranasal sinuses clear.  Negative orbit Other: None IMPRESSION: Image quality degraded by significant motion. No acute abnormality identified. Electronically Signed   By: Franchot Gallo M.D.   On: 11/06/2020 17:12   US Abdomen Limited RUQ (LIVER/GB)  Result Date: 11/06/2020 CLINICAL  DATA:  Elevated liver enzymes EXAM: ULTRASOUND ABDOMEN LIMITED RIGHT UPPER QUADRANT COMPARISON:  None. FINDINGS: Gallbladder: Cholecystectomy Common bile duct: Diameter: 4 mm, normal Liver: Few small cysts measuring up  to 1.1 cm. Possible surface contour nodularity. Minimally increased parenchymal echogenicity. Portal vein is patent on color Doppler imaging with normal direction of blood flow towards the liver. Other: None. IMPRESSION: Mild liver surface contour nodularity raising possibility of cirrhosis. Minimally increased liver echogenicity may reflect steatosis or chronic liver disease. Electronically Signed   By: Macy Mis M.D.   On: 11/06/2020 09:21      Subjective: Patient seen and examined.  Alert and oriented x3.  Appears very pleasant.  Tells me that she is doing fine, no new complaints.  Wishes to go home today.  Discussed in details regarding alcohol cessation and close follow-up with PCP and psychiatry and she verbalized understanding.  Discharge Exam: Vitals:   11/09/20 0447 11/09/20 0751  BP: 120/80 (!) 132/91  Pulse: 75 73  Resp: 20 16  Temp: 98 F (36.7 C) 97.8 F (36.6 C)  SpO2: 97% 99%   Vitals:   11/08/20 1656 11/08/20 2046 11/09/20 0447 11/09/20 0751  BP: 117/83 (!) 122/106 120/80 (!) 132/91  Pulse: 93 71 75 73  Resp: _0 Temp: 97.8 F (36.6 C) 97.9 F (36.6 C) 98 F (36.7 C) 97.8 F (36.6 C)  TempSrc: Oral Oral Oral Oral  SpO2: 98% 95% 97% 99%  Weight:      Height:        General: Pt is alert, awake, not in acute distress Cardiovascular: RRR, S1/S2 +, no rubs, no gallops Respiratory: CTA bilaterally, no wheezing, no rhonchi Abdominal: Soft, NT, ND, bowel sounds + Extremities: no edema, no cyanosis    The results of significant diagnostics from this hospitalization (including imaging, microbiology, ancillary and laboratory) are listed below for reference.     Microbiology: Recent Results (from the past 240 hour(s))  Blood culture (single)     Status: None (Preliminary result)   Collection Time: 11/05/20 10:04 PM   Specimen: BLOOD  Result Value Ref Range Status   Specimen Description BLOOD LEFT ANTECUBITAL  Final   Special Requests   Final     BOTTLES DRAWN AEROBIC AND ANAEROBIC Blood Culture adequate volume   Culture   Final    NO GROWTH 4 DAYS Performed at [facility], [address] [place], [place] [address]    Report Status PENDING  Incomplete  Culture, blood (Routine X 2) w Reflex to ID Panel     Status: None (Preliminary result)   Collection Time: 11/07/20  4:59 AM   Specimen: BLOOD  Result Value Ref Range Status   Specimen Description BLOOD LEFT AC  Final   Special Requests   Final    BOTTLES DRAWN AEROBIC AND ANAEROBIC Blood Culture results may not be optimal due to an excessive volume of blood received in culture bottles   Culture   Final    NO GROWTH 2 DAYS Performed at [facility], [address] [place], [place] [address]    Report Status PENDING  Incomplete     Labs: BNP (last 3 results) No results for input(s): BNP in the last 8760 hours. Basic Metabolic Panel: Recent Labs  Lab 11/05/20 2106 11/06/20 0559 11/06/20 0836 11/07/20 0459 11/08/20 0840 11/09/20 0625  NA 136  --  136 142 141 138  K 3.2*  --  3.2* 3.1* 4.4 4.4  CL 99  --  100 102 105 104  CO2 22  --  _0 GLUCOSE 95  --  116* 86 92 93  BUN 24*  --  _1 CREATININE 1.60*  --  1.34* 0.95 0.91 0.97  CALCIUM 8.6*  --  8.8* 8.9 8.8* 9.0  MG  --  1.7  --  1.8  --   --   PHOS  --  2.7  --   --   --   --    Liver Function Tests: Recent Labs  Lab 11/05/20 2106 11/06/20 0836 11/07/20 0459 11/08/20 0840 11/09/20 0625  AST 144* 208* 212* 131* 136*  ALT 73* 84* 94* 85* 84*  ALKPHOS 128* 100 90 87 85  BILITOT 1.4* 1.1 1.0 1.0 0.8  PROT 6.4* 6.1* 6.1* 5.7* 5.6*  ALBUMIN 3.8 3.4* 3.3* 3.2* 3.1*   Recent Labs  Lab 11/05/20 2106  LIPASE 17   Recent Labs  Lab 11/06/20 0836  AMMONIA <9*   CBC: Recent Labs  Lab 11/05/20 2106 11/06/20 0559 11/06/20 0836 11/07/20 0459 11/07/20 0715 11/08/20 0840 11/09/20 0625  WBC 21.3*   < > 25.1* TEST WILL BE CREDITED 20.6* 15.7* 10.9*  NEUTROABS 19.1*   --   --   --   --   --   --   HGB 12.6   < > 11.8* TEST WILL BE CREDITED 12.7 11.5* 11.5*  HCT 36.1   < > 33.4* TEST WILL BE CREDITED 36.7 34.4* 33.8*  MCV 81.5   < > 80.1 TEST WILL BE CREDITED 82.5 84.5 83.5  PLT 297   < > 286 TEST WILL BE CREDITED 247 274 273   < > = values in this interval not displayed.   Cardiac Enzymes: No results for input(s): CKTOTAL, CKMB, CKMBINDEX, TROPONINI in the last 168 hours. BNP: Invalid input(s): POCBNP CBG: No results for input(s): GLUCAP in the last 168 hours. D-Dimer No results for input(s): DDIMER in the last 72 hours. Hgb A1c No results for input(s): HGBA1C in the last 72 hours. Lipid Profile No results for input(s): CHOL, HDL, LDLCALC, TRIG, CHOLHDL, LDLDIRECT in the last 72 hours. Thyroid function studies No results for input(s): TSH, T4TOTAL, T3FREE, THYROIDAB in the last 72 hours.  Invalid input(s): FREET3 Anemia work up Recent Labs    11/06/20 1325 11/07/20 1919  VITAMINB12 404  --   FERRITIN  --  140  TIBC  --  293  IRON  --  49   Urinalysis    Component Value Date/Time   COLORURINE YELLOW (A) 11/05/2020 1952   APPEARANCEUR HAZY (A) 11/05/2020 1952   LABSPEC 1.012 11/05/2020 1952   PHURINE 5.0 11/05/2020 1952   GLUCOSEU NEGATIVE 11/05/2020 1952   HGBUR MODERATE (A) 11/05/2020 1952   BILIRUBINUR NEGATIVE 11/05/2020 [place] NEGATIVE 11/05/2020 1952   PROTEINUR NEGATIVE 11/05/2020 1952   NITRITE NEGATIVE 11/05/2020 1952   LEUKOCYTESUR NEGATIVE 11/05/2020 1952   Sepsis Labs Invalid input(s): PROCALCITONIN,  WBC,  LACTICIDVEN Microbiology Recent Results (from the past 240 hour(s))  Blood culture (single)     Status: None (Preliminary result)   Collection Time: 11/05/20 10:04 PM   Specimen: BLOOD  Result Value Ref Range Status   Specimen Description BLOOD LEFT ANTECUBITAL  Final   Special Requests   Final    BOTTLES DRAWN AEROBIC AND ANAEROBIC Blood Culture adequate volume   Culture   Final    NO GROWTH 4  DAYS Performed at [facility], [address], [place], [place] [address]    Report Status PENDING  Incomplete  Culture, blood (Routine X 2) w Reflex to ID Panel     Status: None (Preliminary result)   Collection Time: 11/07/20  4:59 AM   Specimen: BLOOD  Result Value Ref Range Status   Specimen Description BLOOD LEFT AC  Final   Special Requests   Final    BOTTLES DRAWN AEROBIC AND ANAEROBIC Blood Culture results may not be optimal due to an excessive volume of blood received in culture bottles   Culture   Final    NO GROWTH 2 DAYS Performed at [facility], [address], [place], [place] [address]    Report Status PENDING  Incomplete     Time coordinating discharge: Over 30 minutes  SIGNED:   Mckinley Jewel, MD  [facility] 11/09/2020, 11:17 AM Pager   If 7PM-7AM, please contact night-coverage [email or web address]

## 2020-11-09 NOTE — Progress Notes (Signed)
Lactic acid came back at 2.6 and MD notified through secure message.

## 2020-11-09 NOTE — Discharge Summary (Deleted)
Physician Discharge Summary  Caroline Serrano OEH:[id number] DOB: 19-Mar-1956 DOA: 11/05/2020  PCP: Freddy Finner, NP  Admit date: 11/05/2020 Discharge date: 11/09/2020  Admitted From: Home  disposition: Home  Recommendations for Outpatient Follow-up:  Follow-up with PCP in 1 week Follow-up with psychiatry in 1 week Repeat CBC and CMP on follow-up visit Advised to avoid drinking alcohol. Take Wellbutrin 300 mg daily instead of 450 mg daily.  Home Health: Home health PT Equipment/Devices: Rolling walker Discharge Condition: Stable CODE STATUS: Full code Diet recommendation: Heart healthy diet  Brief/Interim Summary: Caroline Serrano a 65 y.o.adultwith medical history significant forhypertension, anxiety and alcohol abuse who was brought in after her roommate found her on the floor at home with jerking movements. Patient is unable to contribute to history due to altered mental status. Most of the history is taken from ED provider. Patient usually drinks up to 24 beers a day but had less than her usual. She has no prior history of seizures. ED Course:On arrival, blood pressure 190/154, pulse 109 respirations 25, O2 sat 100% on room air. Blood work significant for leukocytosis of 21,000 with lactic acid of 3.7. CMP showed creatinine of 1.6, with elevated LFTs, AST 144, ALT 73, alk phos 128, total bili 1.4. Potassium 3.2. Alcohol level less than 10, acetaminophen and salicylate levels undetectable. Urinalysis pending Imaging:Chest x-ray showed no active disease. CT head showed no acute intracranial abnormality. Patient received empiric antibiotics in the ER for possible infection. Laceration of scalp repaired in the ER. Hospitalist consulted for admission.  Sepsis: -Unknown underlying etiology?    Patient met sepsis criteria upon admission- presented with tachycardia, tachypnea, altered mental status, AKI with leukocytosis of 21,000 and lactic acid of 3.7. -UA  negative for infection.  Patient started on IV fluids, Rocephin and azithromycin in ED for possible aspiration pneumonia. -Chest x-ray negative for pneumonia, right upper quadrant ultrasound negative for acute cholecystitis.  Blood culture: Negative to date. -IV fluids and IV antibiotics discontinued on 11/08/2020. -Remained afebrile.  Leukocytosis trended down from 25,000-20,000-15,000-10.9.  Lactic acid i trended down to normal    Seizure-like activity?-Likely in the setting of alcohol intoxication/withdrawal: -Patient presented with jerking movements with no prior history of seizure.  Serum alcohol level: Less than 10.  CT head negative for acute findings. -UDS is positive for phencyclidine however patient declined use of any illicit drugs-could be false positive -Continued Ativan as needed for seizure. -EEG negative for seizures.  MRI negative for acute findings.  Appreciate neurology's help-recommended against starting of AEDs-neurology signed off. -On aspiration/seizure precautions.  Acute metabolic encephalopathy: Resolved -Likely in the setting of alcohol intoxication and medication overdose?.  EEG negative for seizures.  No indication to start on AEDs as per neurology recommendations. -CT head negative for acute findings. -Ethanol level: WNL, ammonia level: Less than 9, acetaminophen level and salicylate level: WNL.  COVID-19 negative.  B12, folate: WNL, RPR: Nonreactive -Continued with aspiration/fall precautions. -PT/OT recommended home health PT/OT  Major Depression/anxiety/Mood disorder:  -Patient took handful of anxiety medications prior to arrival to the ED.  Psychiatry consulted- Resume home meds-trazodone, Effexor.    Initially Wellbutrin held given increased risk of seizures. -Patient will be discharged on Wellbutrin 300 mg daily instead of 450 mg (home dose) as per psychiatry recommendations.  No indication of inpatient psych admission at this time.-Appreciate psychiatry's  recommendations. -Advised patient and her friend to follow-up closely with patient's psychiatry.  Elevated liver enzymes: -In the setting of alcohol abuse.  Right upper quadrant ultrasound  shows mild liver surface contour nodularity raising possibility of cirrhosis.  Ammonia level: WNL.  -PT/INR: WNL, acute hepatitis panel: Negative -Patient's liver enzymes-initially trended up and now improving.  -Discussed to avoid alcohol.  Follow-up with PCP in 1 week and repeat CMP on follow-up visit.  Alcohol use disorder: -Serum ethanol level: Less than 10. -Continued with CIWA protocol.  Patient alert and oriented and very pleasant on the day of discharge. -Discharged home on folic acid, thiamine and multivitamins.  Hypokalemia: Resolved  Scalp laceration: -Sutured in ED.  Subclinical hyperthyroidism: -Repeat thyroid panel outpatient with PCP.  Hypertension: Resumed home meds amlodipine and lisinopril and Toprol.  AKI: -AKI resolved with IV hydration.  Discharge Diagnoses:  Sepsis of unknown underlying etiology Seizure-like activity likely in the setting of frequent intoxication/withdrawal Acute metabolic encephalopathy AKI Elevated liver enzymes Alcohol use disorder Hypokalemia Scalp laceration Subclinical hypothyroidism Hypertension Depression/anxiety   Discharge Instructions  Discharge Instructions    Discharge instructions   Complete by: As directed    Follow-up with PCP in 1 week Follow-up with psychiatry in 1 week Repeat CBC and CMP on follow-up visit Advised to avoid drinking alcohol. Take Wellbutrin 300 mg daily instead of 450 mg daily.   Increase activity slowly   Complete by: As directed      Allergies as of 11/09/2020      Reactions   Aspirin Other (See Comments)   Gastric Bypass   Ibuprofen Other (See Comments)   Gastric bypass      Medication List    TAKE these medications   amLODipine 10 MG tablet Commonly known as: NORVASC Take 10 mg by mouth  daily.   aspirin EC 81 MG tablet Take 81 mg by mouth daily.   Biotin 1 MG Caps Take 5 mg by mouth 2 (two) times daily.   buPROPion 300 MG 24 hr tablet Commonly known as: WELLBUTRIN XL Take 300 mg by mouth daily. (for a total of 450 mg) What changed: Another medication with the same name was removed. Continue taking this medication, and follow the directions you see here.   chlorthalidone 25 MG tablet Commonly known as: HYGROTON Take 50 mg by mouth daily.   cyanocobalamin 1000 MCG tablet Take 5,000 mcg by mouth daily.   cyanocobalamin 1000 MCG/ML injection Commonly known as: (VITAMIN B-12) Inject 1 ml intramuscularly once a month   ferrous sulfate 325 (65 FE) MG EC tablet Take 1 tablet by mouth daily.   folic acid 1 MG tablet Commonly known as: FOLVITE Take 1 tablet (1 mg total) by mouth daily. Start taking on: November 10, 2020   hydrOXYzine 10 MG tablet Commonly known as: ATARAX/VISTARIL Take 10 mg by mouth 3 (three) times daily as needed.   lisinopril 40 MG tablet Commonly known as: ZESTRIL Take 40 mg by mouth daily.   metoprolol succinate 25 MG 24 hr tablet Commonly known as: TOPROL-XL Take 25 mg by mouth daily.   multivitamin with minerals Tabs tablet Take 1 tablet by mouth daily.   testosterone cypionate 200 MG/ML injection Commonly known as: DEPOTESTOSTERONE CYPIONATE Inject 6 mg as directed once a week.   thiamine 100 MG tablet Take 1 tablet (100 mg total) by mouth daily. Start taking on: November 10, 2020   traZODone 100 MG tablet Commonly known as: DESYREL Take 100 mg by mouth at bedtime as needed for sleep.   venlafaxine XR 75 MG 24 hr capsule Commonly known as: EFFEXOR-XR Take 225 mg by mouth daily with breakfast.  Allergies  Allergen Reactions  . Aspirin Other (See Comments)    Gastric Bypass  . Ibuprofen Other (See Comments)    Gastric bypass    Consultations:  Neurology  Psychiatry   Procedures/Studies: EEG  Result  Date: 11/06/2020 Lora Havens, MD     11/06/2020  3:42 PM Patient Name: Caroline Serrano MRN: [id number] Epilepsy Attending: Lora Havens Referring Physician/Provider: Dr. Judd Gaudier Date: 11/06/2020 Duration: 21.55 minutes Patient history: 65 year old female with history of alcohol abuse who presented with witnessed seizure.  EEG to evaluate for seizures. Level of alertness: Awake AEDs during EEG study: Ativan Technical aspects: This EEG study was done with scalp electrodes positioned according to the 10-20 International system of electrode placement. Electrical activity was acquired at a sampling rate of 500Hz  and reviewed with a high frequency filter of 70Hz  and a low frequency filter of 1Hz . EEG data were recorded continuously and digitally stored. Description: No clear posterior dominant rhythm was seen.  There is an excessive amount of 15 to 18 Hz beta activity  distributed symmetrically and diffusely. Hyperventilation and photic stimulation were not performed.   ABNORMALITY -Excessive beta, generalized IMPRESSION: This study is within normal limits. No seizures or epileptiform discharges were seen throughout the recording. The excessive beta activity seen in the background is most likely due to the effect of benzodiazepine and is a benign EEG pattern. Lora Havens   DG Chest 1 View  Result Date: 11/05/2020 CLINICAL DATA:  Altered mental status, intoxication EXAM: CHEST  1 VIEW COMPARISON:  None. FINDINGS: Normal heart size. Normal mediastinal contour. No pneumothorax. No pleural effusion. Lungs appear clear, with no acute consolidative airspace disease and no pulmonary edema. IMPRESSION: No active disease. Electronically Signed   By: Ilona Sorrel M.D.   On: 11/05/2020 20:07   CT HEAD WO CONTRAST  Result Date: 11/05/2020 CLINICAL DATA:  Nonverbal jerking motion EXAM: CT HEAD WITHOUT CONTRAST TECHNIQUE: Contiguous axial images were obtained from the base of the skull through the vertex  without intravenous contrast. COMPARISON:  None. FINDINGS: Brain: No acute territorial infarction, hemorrhage or intracranial mass. Mild to moderate atrophy. Nonenlarged ventricles. Vascular: No hyperdense vessels.  Carotid vascular calcification Skull: Normal. Negative for fracture or focal lesion. Sinuses/Orbits: No acute finding. Other: Mild right forehead scalp edema. Incomplete union posterior arch C1. Hypoplastic appearing right mandibular head. IMPRESSION: 1. No CT evidence for acute intracranial abnormality. 2. Atrophy. Electronically Signed   By: Donavan Foil M.D.   On: 11/05/2020 20:36   MR BRAIN WO CONTRAST  Result Date: 11/06/2020 CLINICAL DATA:  Seizure EXAM: MRI HEAD WITHOUT CONTRAST TECHNIQUE: Multiplanar, multiecho pulse sequences of the brain and surrounding structures were obtained without intravenous contrast. COMPARISON:  CT head 11/05/2020 FINDINGS: Brain: Image quality degraded by moderate to extensive motion which became progressive as the study progressed. No acute infarct on diffusion-weighted imaging. Mild white matter changes. Negative for mass or fluid collection. Vascular: Normal arterial flow voids Skull and upper cervical spine: Negative Sinuses/Orbits: Paranasal sinuses clear.  Negative orbit Other: None IMPRESSION: Image quality degraded by significant motion. No acute abnormality identified. Electronically Signed   By: Franchot Gallo M.D.   On: 11/06/2020 17:12   US Abdomen Limited RUQ (LIVER/GB)  Result Date: 11/06/2020 CLINICAL DATA:  Elevated liver enzymes EXAM: ULTRASOUND ABDOMEN LIMITED RIGHT UPPER QUADRANT COMPARISON:  None. FINDINGS: Gallbladder: Cholecystectomy Common bile duct: Diameter: 4 mm, normal Liver: Few small cysts measuring up to 1.1 cm. Possible surface contour nodularity. Minimally increased  parenchymal echogenicity. Portal vein is patent on color Doppler imaging with normal direction of blood flow towards the liver. Other: None. IMPRESSION: Mild liver  surface contour nodularity raising possibility of cirrhosis. Minimally increased liver echogenicity may reflect steatosis or chronic liver disease. Electronically Signed   By: Macy Mis M.D.   On: 11/06/2020 09:21       Subjective: Patient seen and examined.  Alert and oriented x3.  Appears very pleasant.  Tells me that she is doing fine, no new complaints.  Wishes to go home today.  Discussed in details regarding alcohol cessation and close follow-up with PCP and psychiatry and she verbalized understanding.  Discharge Exam: Vitals:   11/09/20 0447 11/09/20 0751  BP: 120/80 (!) 132/91  Pulse: 75 73  Resp: 20 16  Temp: 98 F (36.7 C) 97.8 F (36.6 C)  SpO2: 97% 99%   Vitals:   11/08/20 1656 11/08/20 2046 11/09/20 0447 11/09/20 0751  BP: 117/83 (!) 122/106 120/80 (!) 132/91  Pulse: 93 71 75 73  Resp: 18 19 20 16   Temp: 97.8 F (36.6 C) 97.9 F (36.6 C) 98 F (36.7 C) 97.8 F (36.6 C)  TempSrc: Oral Oral Oral Oral  SpO2: 98% 95% 97% 99%  Weight:      Height:        General: Pt is alert, awake, not in acute distress Cardiovascular: RRR, S1/S2 +, no rubs, no gallops Respiratory: CTA bilaterally, no wheezing, no rhonchi Abdominal: Soft, NT, ND, bowel sounds + Extremities: no edema, no cyanosis    The results of significant diagnostics from this hospitalization (including imaging, microbiology, ancillary and laboratory) are listed below for reference.     Microbiology: Recent Results (from the past 240 hour(s))  Blood culture (single)     Status: None (Preliminary result)   Collection Time: 11/05/20 10:04 PM   Specimen: BLOOD  Result Value Ref Range Status   Specimen Description BLOOD LEFT ANTECUBITAL  Final   Special Requests   Final    BOTTLES DRAWN AEROBIC AND ANAEROBIC Blood Culture adequate volume   Culture   Final    NO GROWTH 4 DAYS Performed at [facility], [address] [place], [place] [address]    Report Status PENDING  Incomplete   Culture, blood (Routine X 2) w Reflex to ID Panel     Status: None (Preliminary result)   Collection Time: 11/07/20  4:59 AM   Specimen: BLOOD  Result Value Ref Range Status   Specimen Description BLOOD LEFT AC  Final   Special Requests   Final    BOTTLES DRAWN AEROBIC AND ANAEROBIC Blood Culture results may not be optimal due to an excessive volume of blood received in culture bottles   Culture   Final    NO GROWTH 2 DAYS Performed at [facility], [address] [place], [place] [address]    Report Status PENDING  Incomplete     Labs: BNP (last 3 results) No results for input(s): BNP in the last 8760 hours. Basic Metabolic Panel: Recent Labs  Lab 11/05/20 2106 11/06/20 0559 11/06/20 0836 11/07/20 0459 11/08/20 0840 11/09/20 0625  NA 136  --  136 142 141 138  K 3.2*  --  3.2* 3.1* 4.4 4.4  CL 99  --  100 102 105 104  CO2 22  --  24 28 27 26   GLUCOSE 95  --  116* 86 92 93  BUN 24*  --  22 13 9 12   CREATININE  1.60*  --  1.34* 0.95 0.91 0.97  CALCIUM 8.6*  --  8.8* 8.9 8.8* 9.0  MG  --  1.7  --  1.8  --   --   PHOS  --  2.7  --   --   --   --    Liver Function Tests: Recent Labs  Lab 11/05/20 2106 11/06/20 0836 11/07/20 0459 11/08/20 0840 11/09/20 0625  AST 144* 208* 212* 131* 136*  ALT 73* 84* 94* 85* 84*  ALKPHOS 128* 100 90 87 85  BILITOT 1.4* 1.1 1.0 1.0 0.8  PROT 6.4* 6.1* 6.1* 5.7* 5.6*  ALBUMIN 3.8 3.4* 3.3* 3.2* 3.1*   Recent Labs  Lab 11/05/20 2106  LIPASE 17   Recent Labs  Lab 11/06/20 0836  AMMONIA <9*   CBC: Recent Labs  Lab 11/05/20 2106 11/06/20 0559 11/06/20 0836 11/07/20 0459 11/07/20 0715 11/08/20 0840 11/09/20 0625  WBC 21.3*   < > 25.1* TEST WILL BE CREDITED 20.6* 15.7* 10.9*  NEUTROABS 19.1*  --   --   --   --   --   --   HGB 12.6   < > 11.8* TEST WILL BE CREDITED 12.7 11.5* 11.5*  HCT 36.1   < > 33.4* TEST WILL BE CREDITED 36.7 34.4* 33.8*  MCV 81.5   < > 80.1 TEST WILL BE CREDITED 82.5 84.5 83.5  PLT 297   <  > 286 TEST WILL BE CREDITED 247 274 273   < > = values in this interval not displayed.   Cardiac Enzymes: No results for input(s): CKTOTAL, CKMB, CKMBINDEX, TROPONINI in the last 168 hours. BNP: Invalid input(s): POCBNP CBG: No results for input(s): GLUCAP in the last 168 hours. D-Dimer No results for input(s): DDIMER in the last 72 hours. Hgb A1c No results for input(s): HGBA1C in the last 72 hours. Lipid Profile No results for input(s): CHOL, HDL, LDLCALC, TRIG, CHOLHDL, LDLDIRECT in the last 72 hours. Thyroid function studies No results for input(s): TSH, T4TOTAL, T3FREE, THYROIDAB in the last 72 hours.  Invalid input(s): FREET3 Anemia work up Recent Labs    11/06/20 1325 11/07/20 1919  VITAMINB12 404  --   FERRITIN  --  140  TIBC  --  293  IRON  --  49   Urinalysis    Component Value Date/Time   COLORURINE YELLOW (A) 11/05/2020 1952   APPEARANCEUR HAZY (A) 11/05/2020 1952   LABSPEC 1.012 11/05/2020 1952   PHURINE 5.0 11/05/2020 1952   GLUCOSEU NEGATIVE 11/05/2020 1952   HGBUR MODERATE (A) 11/05/2020 1952   BILIRUBINUR NEGATIVE 11/05/2020 [place] NEGATIVE 11/05/2020 1952   PROTEINUR NEGATIVE 11/05/2020 1952   NITRITE NEGATIVE 11/05/2020 1952   LEUKOCYTESUR NEGATIVE 11/05/2020 1952   Sepsis Labs Invalid input(s): PROCALCITONIN,  WBC,  LACTICIDVEN Microbiology Recent Results (from the past 240 hour(s))  Blood culture (single)     Status: None (Preliminary result)   Collection Time: 11/05/20 10:04 PM   Specimen: BLOOD  Result Value Ref Range Status   Specimen Description BLOOD LEFT ANTECUBITAL  Final   Special Requests   Final    BOTTLES DRAWN AEROBIC AND ANAEROBIC Blood Culture adequate volume   Culture   Final    NO GROWTH 4 DAYS Performed at [facility], [address] [place], [place] [address]    Report Status PENDING  Incomplete  Culture, blood (Routine X 2) w Reflex to ID Panel     Status: None (Preliminary result)  Collection  Time: 11/07/20  4:59 AM   Specimen: BLOOD  Result Value Ref Range Status   Specimen Description BLOOD LEFT AC  Final   Special Requests   Final    BOTTLES DRAWN AEROBIC AND ANAEROBIC Blood Culture results may not be optimal due to an excessive volume of blood received in culture bottles   Culture   Final    NO GROWTH 2 DAYS Performed at [facility], [address], [place], [place] [address]    Report Status PENDING  Incomplete     Time coordinating discharge: Over 30 minutes  SIGNED:   Mckinley Jewel, MD  [facility] 11/09/2020, 11:03 AM Pager   If 7PM-7AM, please contact night-coverage [email or web address]

## 2020-11-09 NOTE — TOC Transition Note (Signed)
Transition of Care [facility]) - CM/SW Discharge Note   Patient Details  Name: Caroline Serrano MRN: [id number] Date of Birth: 01-15-1956  Transition of Care [facility]) CM/SW Contact:  Bing Quarry, RN Phone Number: 11/09/2020, 3:02 PM   Clinical Narrative:   Patient discharged to home with [facility] via [facility] per [facility]. [employer] (no preferences) contacted for home delivery of ordered DME per PT/OT evaluations. Pt. Left prior to DME deliver to room. Transported home via roommate/friend. Communication with unit RN throughout the day. Gabriel Cirri RN CM          Patient Goals and CMS Choice        Discharge Placement                       Discharge Plan and Services                                     Social Determinants of Health (SDOH) Interventions     Readmission Risk Interventions No flowsheet data found.

## 2020-11-10 LAB — CULTURE, BLOOD (SINGLE)
Culture: NO GROWTH
Special Requests: ADEQUATE

## 2020-11-12 LAB — COPPER, SERUM: Copper: 74 ug/dL — ABNORMAL LOW (ref 80–158)

## 2020-11-12 LAB — CULTURE, BLOOD (ROUTINE X 2): Culture: NO GROWTH

## 2020-11-12 LAB — ZINC: Zinc: 63 ug/dL (ref 44–115)

## 2020-11-13 LAB — VITAMIN E
Vitamin E (Alpha Tocopherol): 7.1 mg/L — ABNORMAL LOW (ref 9.0–29.0)
Vitamin E(Gamma Tocopherol): 0.3 mg/L — ABNORMAL LOW (ref 0.5–4.9)

## 2020-11-13 LAB — VITAMIN B1: Vitamin B1 (Thiamine): 227.1 nmol/L — ABNORMAL HIGH (ref 66.5–200.0)

## 2020-11-13 LAB — VITAMIN A: Vitamin A (Retinoic Acid): 30.2 ug/dL (ref 22.0–69.5)

## 2020-11-20 LAB — VITAMIN K1, SERUM: VITAMIN K1: 0.37 ng/mL (ref 0.10–2.20)

## 2020-12-30 ENCOUNTER — Other Ambulatory Visit: Payer: Medicaid Other | Admitting: Pharmacist

## 2021-02-26 ENCOUNTER — Other Ambulatory Visit: Payer: Self-pay | Admitting: Primary Care

## 2021-02-26 DIAGNOSIS — Z1231 Encounter for screening mammogram for malignant neoplasm of breast: Secondary | ICD-10-CM

## 2022-05-04 ENCOUNTER — Ambulatory Visit (INDEPENDENT_AMBULATORY_CARE_PROVIDER_SITE_OTHER): Payer: Medicare HMO | Admitting: Internal Medicine

## 2022-05-04 VITALS — BP 111/80 | HR 102 | Resp 12 | Ht 62.0 in | Wt 139.0 lb

## 2022-05-04 DIAGNOSIS — I10 Essential (primary) hypertension: Secondary | ICD-10-CM

## 2022-05-04 DIAGNOSIS — G4733 Obstructive sleep apnea (adult) (pediatric): Secondary | ICD-10-CM | POA: Diagnosis not present

## 2022-05-04 NOTE — Progress Notes (Signed)
Sleep Medicine   Office Visit  Patient Name: Caroline Serrano DOB: Jul 08, 1956 MRN [id number]    Chief Complaint: sleep evaluation  Brief History:  Caroline Serrano presents for an initial consult for sleep evaluation and to establish care. Patient states being diagnosed with OSA about 15 years ago. Studies are not available. CPAP was used off and on for a year. Patient's sleep quality is poor due to frequent awakenings during the night and feeling tired when waking up. This is noted most nights. Bed partner reports snoring and pauses in breathing.The patient relates the following symptoms: waking tired, snores that wakes him,irritable during the day, excessively sleepy during the day are also present. The patient goes to sleep at 11:00pm and wakes up at 6:00am.  Sleep quality is the same when outside home environment.  Patient has noted does not complain  of his legs at night that would not disrupt his sleep.  The patient  relates no unusual behavior during the night.  The patient reports a history of psychiatric problems. The Epworth Sleepiness Score is 14 out of 24 .  The patient relates  Cardiovascular risk factors include: hypertension.  The patient reports gastric bypass surgery in 1994 and lost a total 150lbs since.     ROS  General: (-) fever, (-) chills, (-) night sweat Nose and Sinuses: (-) nasal stuffiness or itchiness, (-) postnasal drip, (-) nosebleeds, (-) sinus trouble. Mouth and Throat: (-) sore throat, (-) hoarseness. Neck: (-) swollen glands, (-) enlarged thyroid, (-) neck pain. Respiratory: - cough, - shortness of breath, - wheezing. Neurologic: - numbness, - tingling. Psychiatric: + anxiety, + depression Sleep behavior: -sleep paralysis -hypnogogic hallucinations -dream enactment      -vivid dreams -cataplexy -night terrors -sleep walking   Current Medication: Outpatient Encounter Medications as of 05/04/2022  Medication Sig   buPROPion (WELLBUTRIN XL) 300 MG 24 hr  tablet Take by mouth.   venlafaxine XR (EFFEXOR-XR) 75 MG 24 hr capsule Take by mouth.   amLODipine (NORVASC) 10 MG tablet Take 10 mg by mouth daily.   amLODipine (NORVASC) 5 MG tablet Take 5 mg by mouth daily.   aspirin EC 81 MG tablet Take 81 mg by mouth daily.   atorvastatin (LIPITOR) 20 MG tablet Take 20 mg by mouth at bedtime.   Biotin 1 MG CAPS Take 5 mg by mouth 2 (two) times daily.   buPROPion (WELLBUTRIN XL) 300 MG 24 hr tablet Take 300 mg by mouth daily. (for a total of 450 mg)   chlorthalidone (HYGROTON) 25 MG tablet Take 50 mg by mouth daily.   chlorthalidone (HYGROTON) 50 MG tablet Take 50 mg by mouth daily.   cyanocobalamin (,VITAMIN B-12,) 1000 MCG/ML injection Inject 1 ml intramuscularly once a month   cyanocobalamin 1000 MCG tablet Take 5,000 mcg by mouth daily. (Patient not taking: No sig reported)   ferrous sulfate 325 (65 FE) MG EC tablet Take 1 tablet by mouth daily.   folic acid (FOLVITE) 1 MG tablet Take 1 tablet (1 mg total) by mouth daily.   hydrOXYzine (ATARAX/VISTARIL) 10 MG tablet Take 10 mg by mouth 3 (three) times daily as needed.   lisinopril (PRINIVIL,ZESTRIL) 40 MG tablet Take 40 mg by mouth daily.   metoprolol succinate (TOPROL-XL) 25 MG 24 hr tablet Take 25 mg by mouth daily.   Multiple Vitamin (MULTIVITAMIN WITH MINERALS) TABS tablet Take 1 tablet by mouth daily.   testosterone cypionate (DEPOTESTOSTERONE CYPIONATE) 200 MG/ML injection Inject 6 mg as directed once a week.  thiamine 100 MG tablet Take 1 tablet (100 mg total) by mouth daily.   traZODone (DESYREL) 100 MG tablet Take 100 mg by mouth at bedtime as needed for sleep.   venlafaxine XR (EFFEXOR-XR) 75 MG 24 hr capsule Take 225 mg by mouth daily with breakfast. (Patient not taking: No sig reported)   No facility-administered encounter medications on file as of 05/04/2022.    Surgical History: Past Surgical History:  Procedure Laterality Date   GASTRIC BYPASS      Medical History: Past  Medical History:  Diagnosis Date   Anxiety    Depression     Family History: Non contributory to the present illness  Social History: Social History   Socioeconomic History   Marital status: Divorced    Spouse name: Not on file   Number of children: Not on file   Years of education: Not on file   Highest education level: Not on file  Occupational History   Not on file  Tobacco Use   Smoking status: Never   Smokeless tobacco: Never  Vaping Use   Vaping Use: Never used  Substance and Sexual Activity   Alcohol use: Yes    Comment: only occasional   Drug use: No   Sexual activity: Not on file  Other Topics Concern   Not on file  Social History Narrative   Not on file   Social Determinants of Health   Financial Resource Strain: Not on file  Food Insecurity: Not on file  Transportation Needs: Not on file  Physical Activity: Not on file  Stress: Not on file  Social Connections: Not on file  Intimate Partner Violence: Not on file    Vital Signs: Blood pressure 111/80, pulse (!) 102, resp. rate 12, height 5\' 2"  (1.575 m), weight 139 lb (63 kg), SpO2 98 %. Body mass index is 25.42 kg/m.   Examination: General Appearance: The patient is well-developed, well-nourished, and in no distress. Neck Circumference: 34cm Skin: Gross inspection of skin unremarkable. Head: normocephalic, no gross deformities. Eyes: no gross deformities noted. ENT: ears appear grossly normal Neurologic: Alert and oriented. No involuntary movements.    EPWORTH SLEEPINESS SCALE:  Scale:  (0)= no chance of dozing; (1)= slight chance of dozing; (2)= moderate chance of dozing; (3)= high chance of dozing  Chance  Situtation    Sitting and reading: 3    Watching TV: 3    Sitting Inactive in public: 1    As a passenger in car: 1      Lying down to rest: 3    Sitting and talking: 0    Sitting quielty after lunch: 3    In a car, stopped in traffic: 0   TOTAL SCORE:   14 out of  24    SLEEP STUDIES:  Patient reports a study being done a year and a half ago at [facility]. We will request record   LABS: No results found for this or any previous visit (from the past 2160 hour(s)).  Radiology: MR BRAIN WO CONTRAST  Result Date: 11/06/2020 CLINICAL DATA:  Seizure EXAM: MRI HEAD WITHOUT CONTRAST TECHNIQUE: Multiplanar, multiecho pulse sequences of the brain and surrounding structures were obtained without intravenous contrast. COMPARISON:  CT head 11/05/2020 FINDINGS: Brain: Image quality degraded by moderate to extensive motion which became progressive as the study progressed. No acute infarct on diffusion-weighted imaging. Mild white matter changes. Negative for mass or fluid collection. Vascular: Normal arterial flow voids Skull and upper cervical spine: Negative Sinuses/Orbits:  Paranasal sinuses clear.  Negative orbit Other: None IMPRESSION: Image quality degraded by significant motion. No acute abnormality identified. Electronically Signed   By: Marlan Palau M.D.   On: 11/06/2020 17:12   EEG  Result Date: 11/06/2020 Charlsie Quest, MD     11/06/2020  3:42 PM Patient Name: Toia Micale MRN: [id number] Epilepsy Attending: Charlsie Quest Referring Physician/Provider: Dr. Lindajo Royal Date: 11/06/2020 Duration: 21.55 minutes Patient history: 66 year old female with history of alcohol abuse who presented with witnessed seizure.  EEG to evaluate for seizures. Level of alertness: Awake AEDs during EEG study: Ativan Technical aspects: This EEG study was done with scalp electrodes positioned according to the 10-20 International system of electrode placement. Electrical activity was acquired at a sampling rate of 500Hz  and reviewed with a high frequency filter of 70Hz  and a low frequency filter of 1Hz . EEG data were recorded continuously and digitally stored. Description: No clear posterior dominant rhythm was seen.  There is an excessive amount of 15 to 18 Hz beta activity   distributed symmetrically and diffusely. Hyperventilation and photic stimulation were not performed.   ABNORMALITY -Excessive beta, generalized IMPRESSION: This study is within normal limits. No seizures or epileptiform discharges were seen throughout the recording. The excessive beta activity seen in the background is most likely due to the effect of benzodiazepine and is a benign EEG pattern. Priyanka   Abdomen Limited RUQ (LIVER/GB)  Result Date: 11/06/2020 CLINICAL DATA:  Elevated liver enzymes EXAM: ULTRASOUND ABDOMEN LIMITED RIGHT UPPER QUADRANT COMPARISON:  None. FINDINGS: Gallbladder: Cholecystectomy Common bile duct: Diameter: 4 mm, normal Liver: Few small cysts measuring up to 1.1 cm. Possible surface contour nodularity. Minimally increased parenchymal echogenicity. Portal vein is patent on color Doppler imaging with normal direction of blood flow towards the liver. Other: None. IMPRESSION: Mild liver surface contour nodularity raising possibility of cirrhosis. Minimally increased liver echogenicity may reflect steatosis or chronic liver disease. Electronically Signed   By: Annabelle Harman M.D.   On: 11/06/2020 09:21   CT HEAD WO CONTRAST  Result Date: 11/05/2020 CLINICAL DATA:  Nonverbal jerking motion EXAM: CT HEAD WITHOUT CONTRAST TECHNIQUE: Contiguous axial images were obtained from the base of the skull through the vertex without intravenous contrast. COMPARISON:  None. FINDINGS: Brain: No acute territorial infarction, hemorrhage or intracranial mass. Mild to moderate atrophy. Nonenlarged ventricles. Vascular: No hyperdense vessels.  Carotid vascular calcification Skull: Normal. Negative for fracture or focal lesion. Sinuses/Orbits: No acute finding. Other: Mild right forehead scalp edema. Incomplete union posterior arch C1. Hypoplastic appearing right mandibular head. IMPRESSION: 1. No CT evidence for acute intracranial abnormality. 2. Atrophy. Electronically Signed   By: Guadlupe Spanish M.D.   On: 11/05/2020 20:36   DG Chest 1 View  Result Date: 11/05/2020 CLINICAL DATA:  Altered mental status, intoxication EXAM: CHEST  1 VIEW COMPARISON:  None. FINDINGS: Normal heart size. Normal mediastinal contour. No pneumothorax. No pleural effusion. Lungs appear clear, with no acute consolidative airspace disease and no pulmonary edema. IMPRESSION: No active disease. Electronically Signed   By: Jasmine Pang M.D.   On: 11/05/2020 20:07    No results found.  No results found.    Assessment and Plan: Patient Active Problem List   Diagnosis Date Noted   OSA (obstructive sleep apnea) 05/04/2022   Severe recurrent major depression without psychotic features (HCC) 11/07/2020   Alcohol use disorder, moderate, dependence (HCC) 11/06/2020   Acute metabolic encephalopathy 11/06/2020   Alcohol withdrawal seizure with  complication, with unspecified complication (HCC) 11/06/2020   Lactic acidosis 11/06/2020   Elevated liver enzymes 11/06/2020   Sepsis (HCC) 11/06/2020   Seizure (HCC) 11/06/2020   HTN (hypertension) 11/06/2020   Anxiety 11/06/2020   Scalp laceration, initial encounter 11/06/2020   1. OSA (obstructive sleep apnea) PLAN OSA:   Patient evaluation suggests high risk of sleep disordered breathing due to witnessed apnea, snoring, gasping, daytime sleepiness and irritability.  Patient has comorbid cardiovascular risk factors including: hypertension which could be exacerbated by pathologic sleep-disordered breathing.  Suggest: PSG to assess the patient's sleep disordered breathing. The patient was also counselled on sleep hygiene to optimize sleep health.  2. Primary hypertension Hypertension Counseling:   The following hypertensive lifestyle modification were recommended and discussed:  1. Limiting alcohol intake to less than 1 oz/day of ethanol:(24 oz of beer or 8 oz of wine or 2 oz of 100-proof whiskey). 2. Take baby ASA 81 mg daily. 3. Importance of regular  aerobic exercise and losing weight. 4. Reduce dietary saturated fat and cholesterol intake for overall cardiovascular health. 5. Maintaining adequate dietary potassium, calcium, and magnesium intake. 6. Regular monitoring of the blood pressure. 7. Reduce sodium intake to less than 100 mmol/day (less than 2.3 gm of sodium or less than 6 gm of sodium choride)      General Counseling: I have discussed the findings of the evaluation and examination with Angelyne.  I have also discussed any further diagnostic evaluation thatmay be needed or ordered today. Mehr verbalizes understanding of the findings of todays visit. We also reviewed his medications today and discussed drug interactions and side effects including but not limited excessive drowsiness and altered mental states. We also discussed that there is always a risk not just to him but also people around him. he has been encouraged to call the office with any questions or concerns that should arise related to todays visit.  No orders of the defined types were placed in this encounter.       I have personally obtained a history, evaluated the patient, evaluated pertinent data, formulated the assessment and plan and placed orders.   This patient was seen today by Emmaline Kluver, PA-C in collaboration with Dr. Freda Munro.   Yevonne Pax, MD [facility] Diplomate ABMS Pulmonary and Critical Care Medicine Sleep medicine

## 2022-05-28 ENCOUNTER — Encounter (INDEPENDENT_AMBULATORY_CARE_PROVIDER_SITE_OTHER): Payer: Medicare HMO | Admitting: Internal Medicine

## 2022-05-28 DIAGNOSIS — G4710 Hypersomnia, unspecified: Secondary | ICD-10-CM | POA: Diagnosis not present

## 2022-05-28 DIAGNOSIS — G4719 Other hypersomnia: Secondary | ICD-10-CM

## 2022-06-02 NOTE — Procedures (Signed)
[facility]  Polysomnogram Report Part I                                                                 Phone: [phone] Fax: [phone]  Patient Name: Caroline Serrano, Caroline Serrano. Acquisition Number: [id number]  Date of Birth: 04/15/1956 Acquisition Date: 05/28/2022  Referring Physician: Dorcas Mcmurray FNP     History: The patient is a 66 year old female who was referred for evaluation of possible sleep apnea with snoring and sleepiness. Medical History: osa, hypertension, anxiety, depression, lactic acidosis, elevated liver enzymes, sepsis, seizure.  Medications: bupropion, venlafaxine, amlodipine, aspirin, biotin, chlorthalidone, cyanocobalamin, ferrous sulfate, folic acid, hydroxyzine, lisinopril, metoprolol succinate, multi vitamin, testosterone cypionate, thiamine, trazodone, venlafaxine.  Procedure: This routine overnight polysomnogram was performed on the [facility] 5 using the standard diagnostic protocol. This included 6 channels of EEG, 2 channels of EOG, chin EMG, bilateral anterior tibialis EMG, nasal/oral thermistor, PTAF (nasal pressure transducer), chest and abdominal wall movements, EKG, and pulse oximetry.  Description: The total recording time was 427.5 minutes. The total sleep time was 390.0 minutes. There were a total of 27.5 minutes of wakefulness after sleep onset for a slightly reducedsleep efficiency of 91.2%. The latency to sleep onset was within normal limitsat 10.0 minutes. The R sleep onset latency was prolonged at 165.5 minutes. Sleep parameters, as a percentage of the total sleep time, demonstrated 5.0% of sleep was in N1 sleep, 92.2% N2, 0.0% N3 and 2.8% R sleep. There were a total of 131 arousals for an arousal index of 20.2 arousals per hour of sleep that was elevated.  Respiratory monitoring demonstrated frequent mild degree of snoring on the back. There were 129 apneas and hypopneas for an Apnea Hypopnea Index of 19.8 apneas and hypopneas per hour of  sleep. The REM related apnea hypopnea index was 0.0/hr of REM sleep compared to a NREM AHI of 20.4/hr.  The average duration of the respiratory events was 29.1 seconds with a maximum duration of 61.0 seconds. The respiratory events occurred exclusively in the supine position. The respiratory events were associated with peripheral oxygen desaturations on the average to 91%. The lowest oxygen desaturation associated with a respiratory event was 84%. Additionally, the baseline oxygen saturation during wakefulness was 97%, during NREM sleep averaged 97%, and during REM sleep averaged  98%. The total duration of oxygen < 90% was 6.3 minutes and <80% was 0.2 minutes.  Cardiac monitoring- did not demonstrate transient cardiac decelerations associated with the apneas. There were no significant cardiac rhythm irregularities.   Periodic limb movement monitoring- demonstrated that there were 4 periodic limb movements for a periodic limb movement index of 0.6 periodic limb movements per hour of sleep.   Impression: This routine overnight polysomnogram demonstrated significant, position-dependent obstructive sleep apnea with an overall Apnea Hypopnea Index of 19.8 apneas and hypopneas per hour of sleep. The respiratory occurred exclusively in the supine position with an AHI of 26.9 and were more severe in supine REM with an AHI of 81.2. The lowest desaturation was to 84%.  As REM percentage was markedly reduced, the findings likely underestimate the severity of the sleep apnea.  There was a slightly reduced sleep efficiency with anelevated arousal index, a reduced REM percentage and no slow  wave sleep. These findings would appear to be due to the obstructive sleep apnea.  Recommendations:    A CPAP titration would be recommended due to the severity of the sleep apnea. Some supine sleep should be ensured to optimize the titration    Yevonne Pax, MD, [facility] Diplomate ABMS-Pulmonary, Critical Care and Sleep  Medicine  Electronically reviewed and digitally signed  [facility] Polysomnogram Report Part II  Phone: [phone] Fax: [phone]  Patient last name Serrano Neck Size 14.0   in. Acquisition [id number]  Patient first name Caroline L. Weight 139.0 lbs. Started 05/28/2022 at 9:46:30 PM  Birth date August 30, 1956 Height 62.0 in. Stopped 05/29/2022 at 5:09:36 AM  Age 35 BMI 25.4 lb/in2 Duration 427.5  Study Type Adult      Robbi Garter - RPSGT, Reita Chard Reviewed by: Valentino Hue. Henke, PhD, ABSM, FAASM Sleep Data: Lights Out: 9:59:00 PM Sleep Onset: 10:09:00 PM  Lights On: 5:06:30 AM Sleep Efficiency: 91.2 %  Total Recording Time: 427.5 min Sleep Latency (from Lights Off) 10.0 min  Total Sleep Time (TST): 390.0 min R Latency (from Sleep Onset): 165.5 min  Sleep Period Time: 417.0 min Total number of awakenings: 12  Wake during sleep: 27.0 min Wake After Sleep Onset (WASO): 27.5 min   Sleep Data:         Arousal Summary: Stage  Latency from lights out (min) Latency from sleep onset (min) Duration (min) % Total Sleep Time  Normal values  N 1 10.0 0.0 19.5 5.0 (5%)  N 2 11.5 1.5 359.5 92.2 (50%)  N 3       0.0 0.0 (20%)  R 175.5 165.5 11.0 2.8 (25%)    Number Index  Spontaneous 54 8.3  Apneas & Hypopneas 70 10.8  RERAs 0 0.0       (Apneas & Hypopneas & RERAs)  (70) (10.8)  Limb Movement 7 1.1  Snore 0 0.0  TOTAL 131 20.2     Respiratory Data:  CA OA MA Apnea Hypopnea* A+ H RERA Total  Number 1 68 5 74 55 129 0 129  Mean Dur (sec) 10.0 26.4 33.3 26.6 32.4 29.1 0.0 29.1  Max Dur (sec) 10.0 43.0 39.0 43.0 61.0 61.0 0.0 61.0  Total Dur (min) 0.2 29.9 2.8 32.9 29.7 62.6 0.0 62.6  % of TST 0.0 7.7 0.7 8.4 7.6 16.0 0.0 16.0  Index (#/h TST) 0.2 10.5 0.8 11.4 8.5 19.8 0.0 19.8  *Hypopneas scored based on 4% or greater desaturation.  Sleep Stage:        REM NREM TST  AHI 0.0 20.4 19.8  RDI 0.0 20.4 19.8           Body Position Data:  Sleep (min) TST (%)  REM (min) NREM (min) CA (#) OA (#) MA (#) HYP (#) AHI (#/h) RERA (#) RDI (#/h) Desat (#)  Supine 287.4 73.69 0.0 287.4 1 68 5 55 26.9 0 26.9 160  Non-Supine 102.60 26.31 11.00 91.60 0.00 0.00 0.00 0.00 0.00 0 0.00 1.00  Right: 102.6 26.31 11.0 91.6 0 0 0 0 0.0 0 0.00 1     Snoring: Total number of snoring episodes  0  Total time with snoring    min (   % of sleep)   Oximetry Distribution:             WK REM NREM TOTAL  Average (%)   97 98 97 97  < 90% 0.5 0.0 5.8 6.3  <  80% 0.2 0.0 0.0 0.2  < 70% 0.2 0.0 0.0 0.2  # of Desaturations* 1 0 161 162  Desat Index (#/hour) 2.1 0.0 25.5 25.0  Desat Max (%) 4 0 16 16  Desat Max Dur (sec) 90.0 0.0 113.0 113.0  Approx Min O2 during sleep 84  Approx min O2 during a respiratory event 84  Was Oxygen added (Y/N) and final rate No:   0 LPM  *Desaturations based on 3% or greater drop from baseline.   Cheyne Stokes Breathing: None Present   Heart Rate Summary:  Average Heart Rate During Sleep 82.6 bpm      Highest Heart Rate During Sleep (95th %) 87.0 bpm      Highest Heart Rate During Sleep 149 bpm      Highest Heart Rate During Recording (TIB) 189 bpm (artifact)   Heart Rate Observations: Event Type # Events   Bradycardia 0 Lowest HR Scored: N/A  Sinus Tachycardia During Sleep 0 Highest HR Scored: N/A  Narrow Complex Tachycardia 0 Highest HR Scored: N/A  Wide Complex Tachycardia 0 Highest HR Scored: N/A  Asystole 0 Longest Pause: N/A  Atrial Fibrillation 0 Duration Longest Event: N/A  Other Arrythmias  No Type:    Periodic Limb Movement Data: (Primary legs unless otherwise noted) Total # Limb Movement 12 Limb Movement Index 1.8  Total # PLMS 4 PLMS Index 0.6  Total # PLMS Arousals 3 PLMS Arousal Index 0.5  Percentage Sleep Time with PLMS 2.35min (0.7 % sleep)  Mean Duration limb movements (secs) 159.0

## 2022-06-04 IMAGING — DX DG CHEST 1V
1 series · 1 of 1 positions shown · non-contrast
Comparison: None.

CLINICAL DATA: Altered mental status, intoxication

EXAM:
CHEST  1 VIEW

[chest ap]
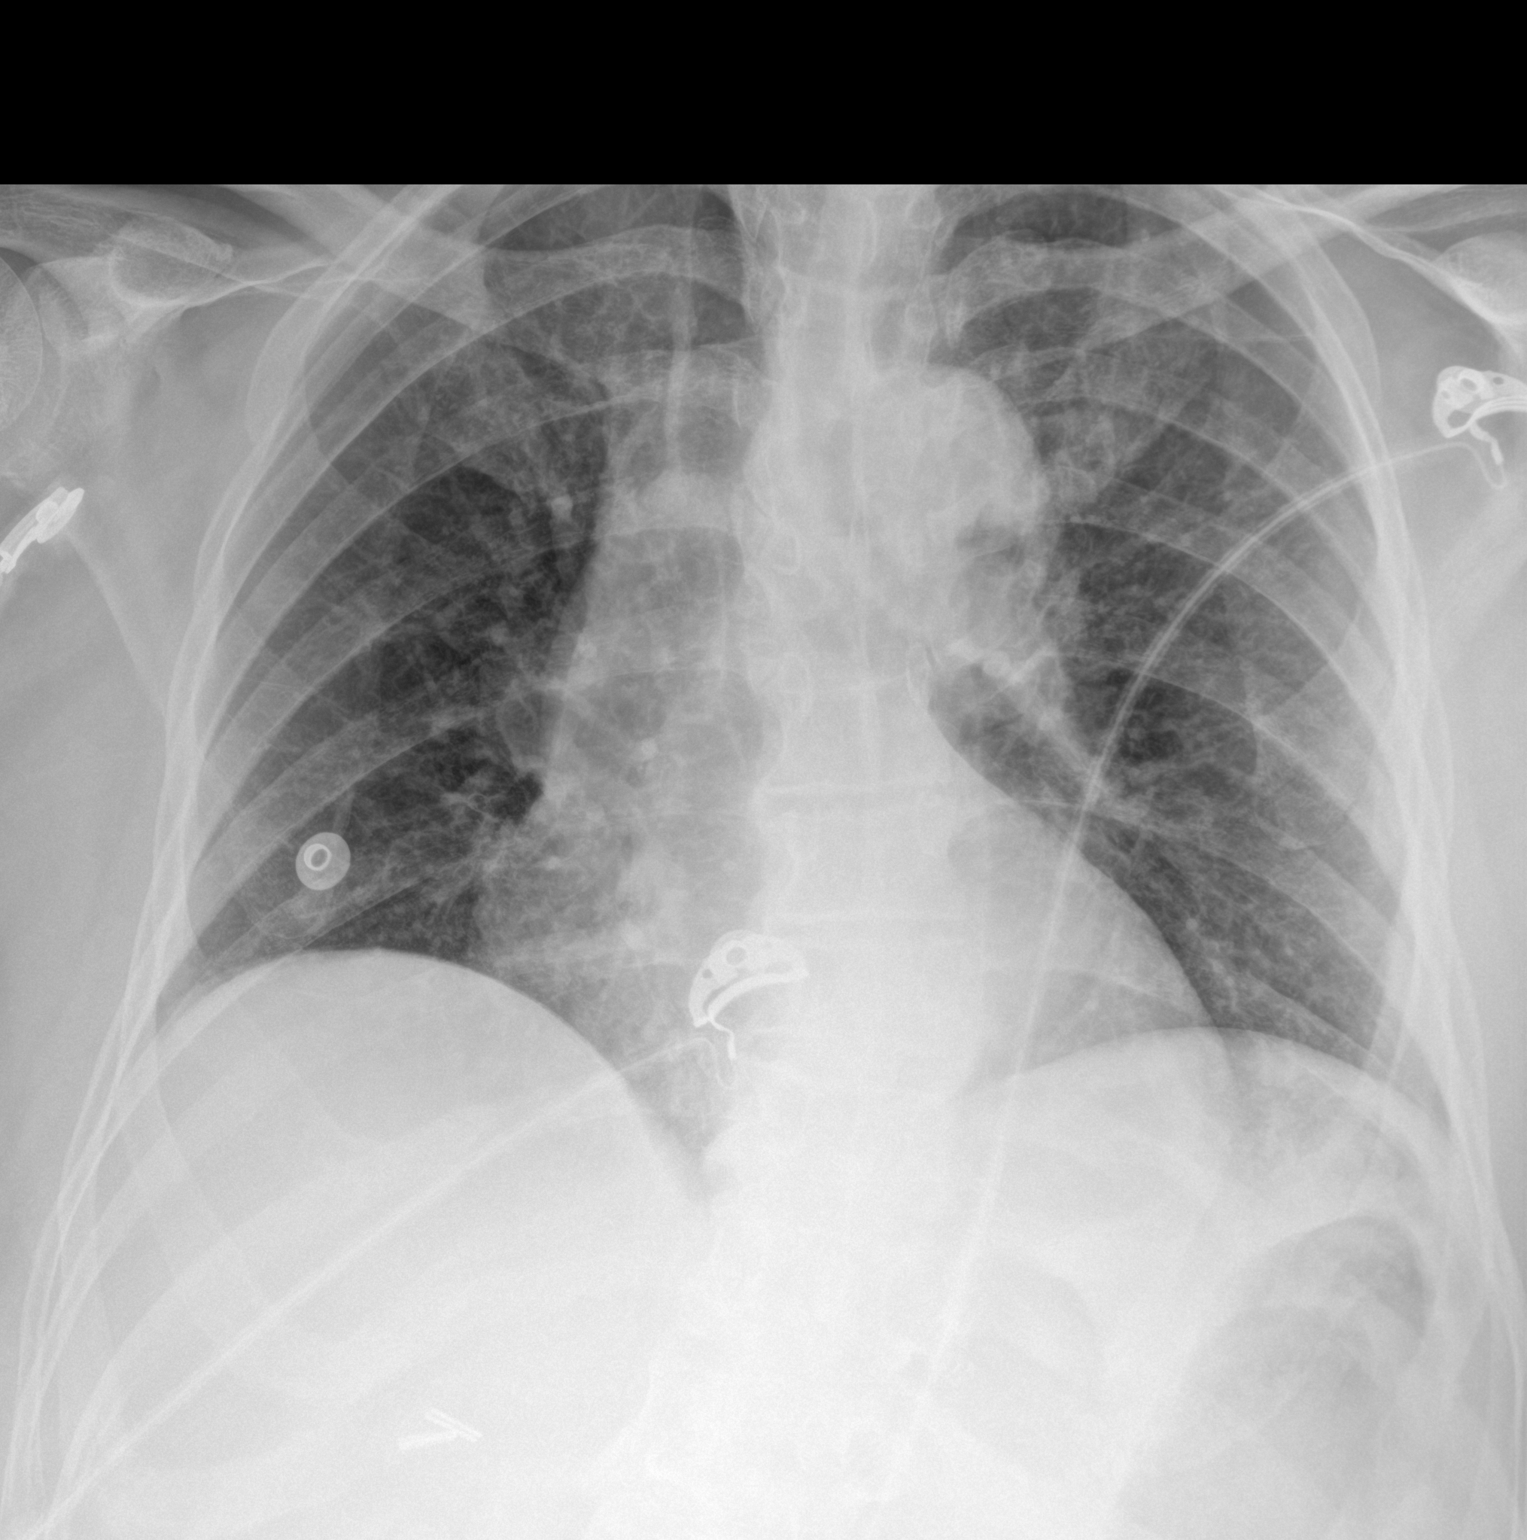

[1 of 1 positions shown; findings below may reference images not displayed]

FINDINGS: Normal heart size. Normal mediastinal contour. No pneumothorax. No
pleural effusion. Lungs appear clear, with no acute consolidative
airspace disease and no pulmonary edema.
IMPRESSION: No active disease.

## 2022-06-05 IMAGING — US US ABDOMEN LIMITED
1 series · 14 of 25 positions shown · non-contrast
Comparison: None.

CLINICAL DATA: Elevated liver enzymes

EXAM:
ULTRASOUND ABDOMEN LIMITED RIGHT UPPER QUADRANT

[Series 1: us abdomen limited ruq (liver/gb) · 14 of 49 slices shown]
[im 1/49]
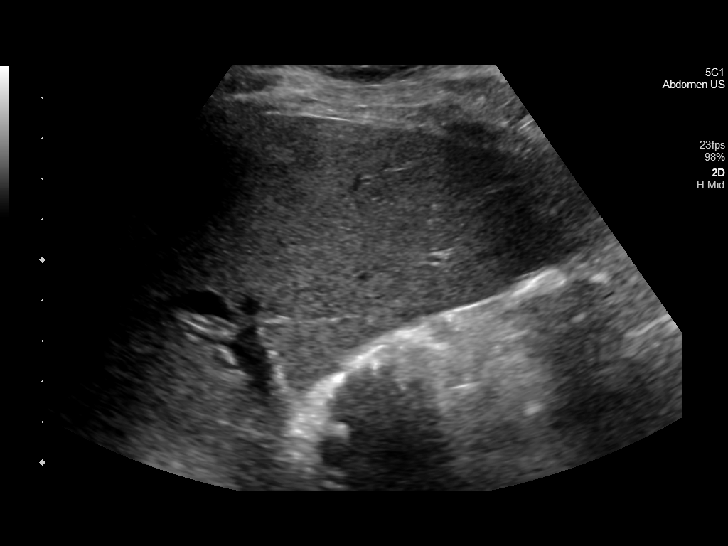
[im 5/49]
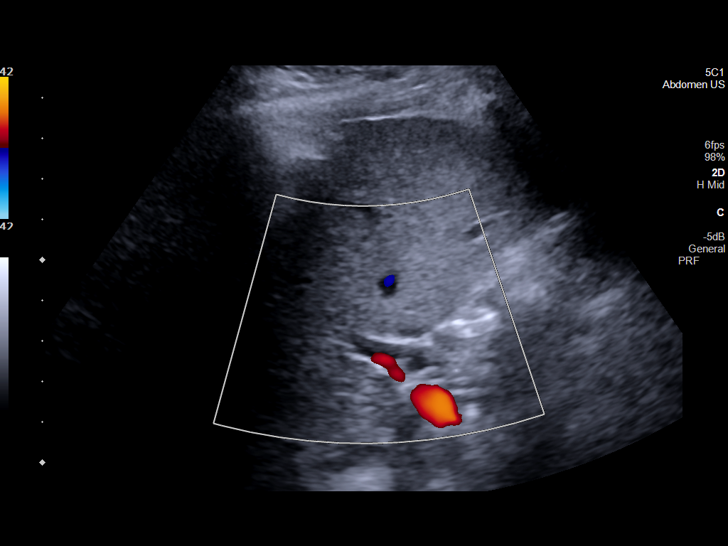
[im 9/49]
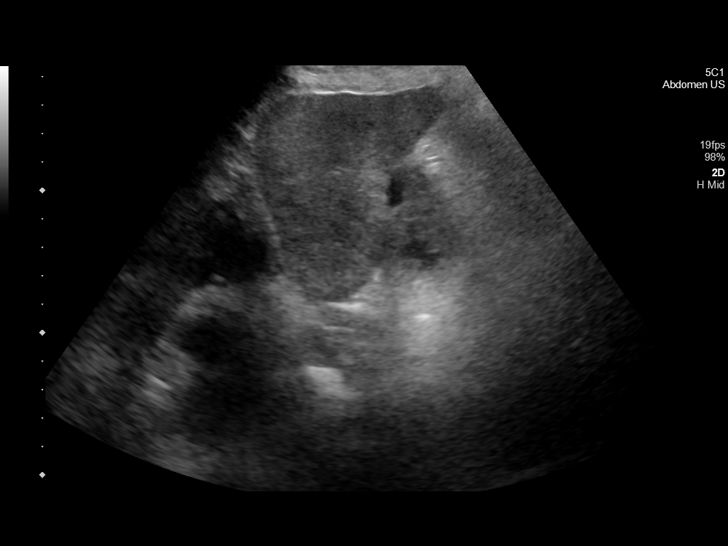
[im 13/49]
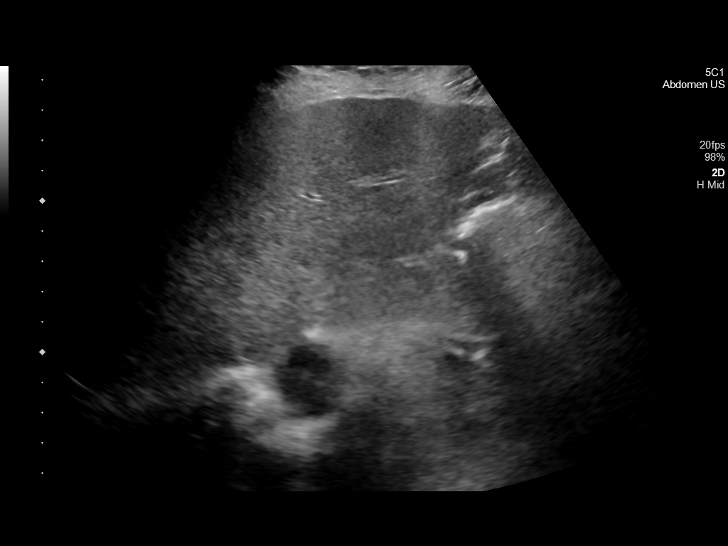
[im 17/49]
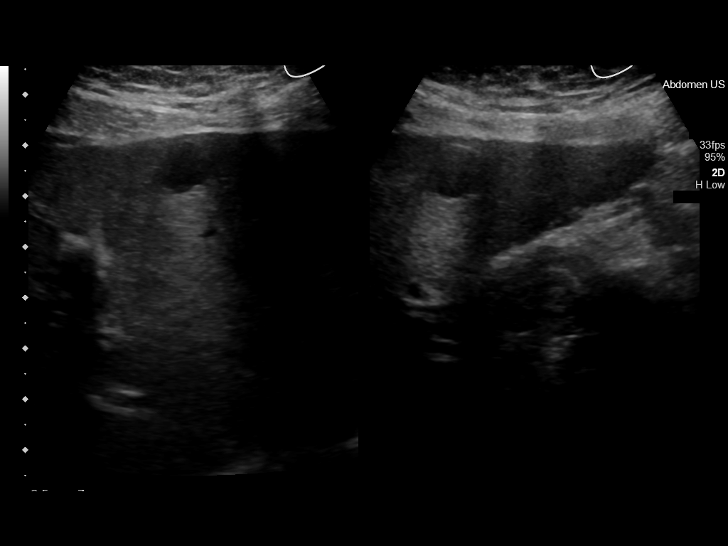
[im 19/49]
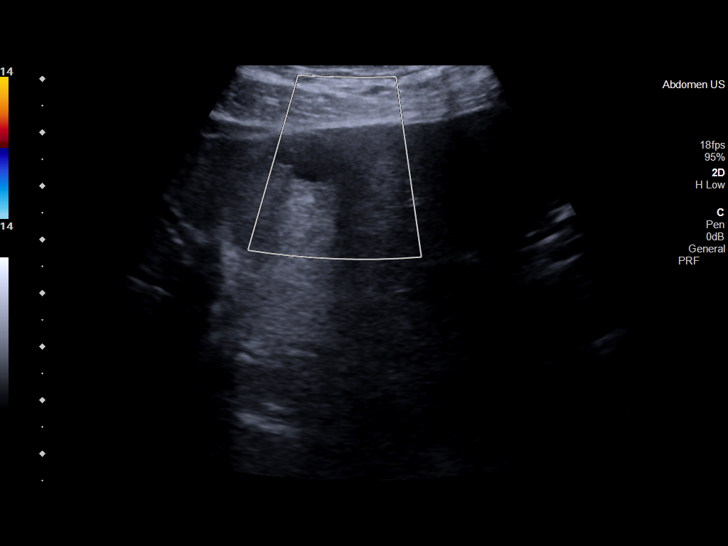
[im 23/49]
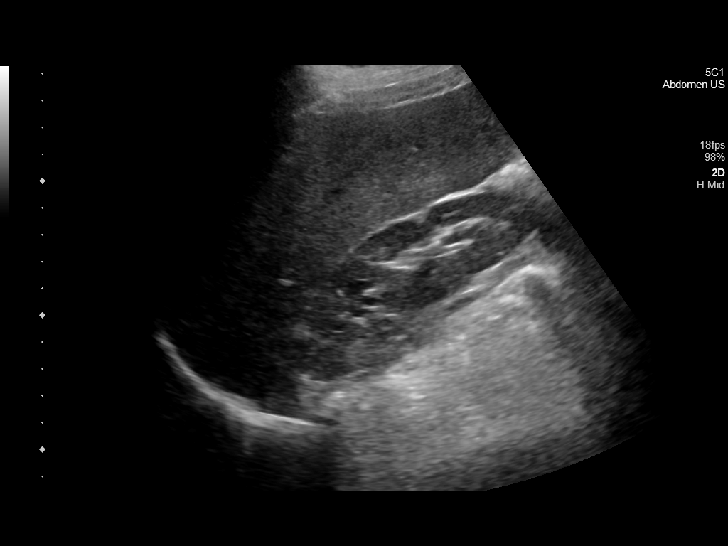
[im 27/49]
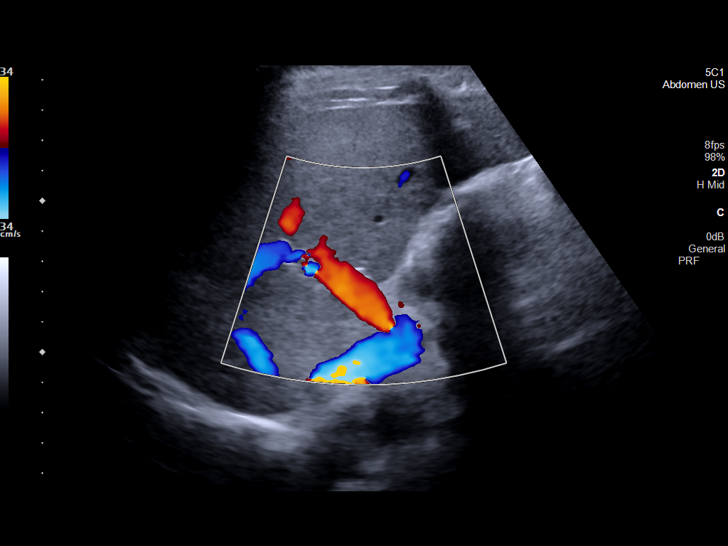
[im 31/49]
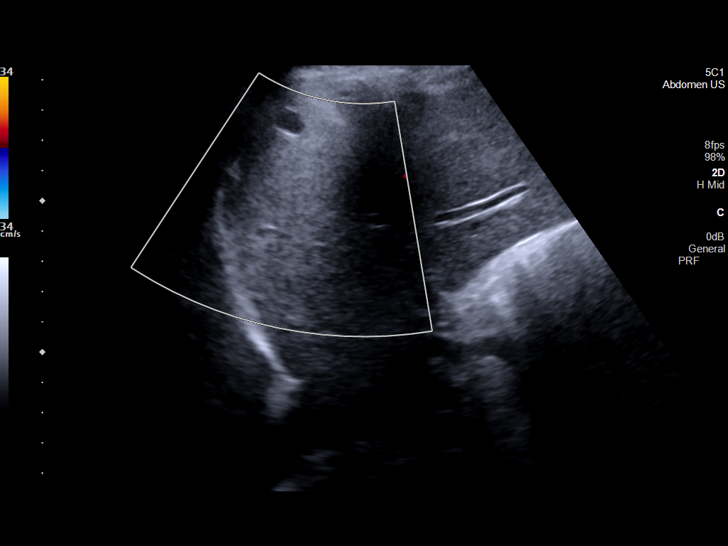
[im 33/49]
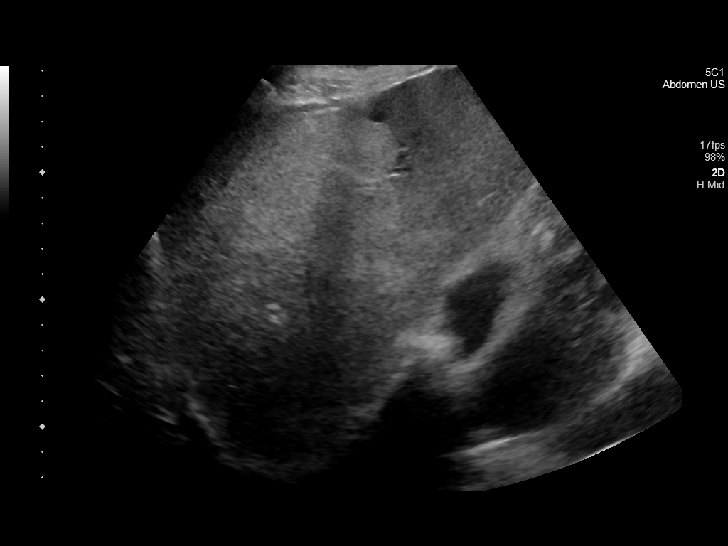
[im 37/49]
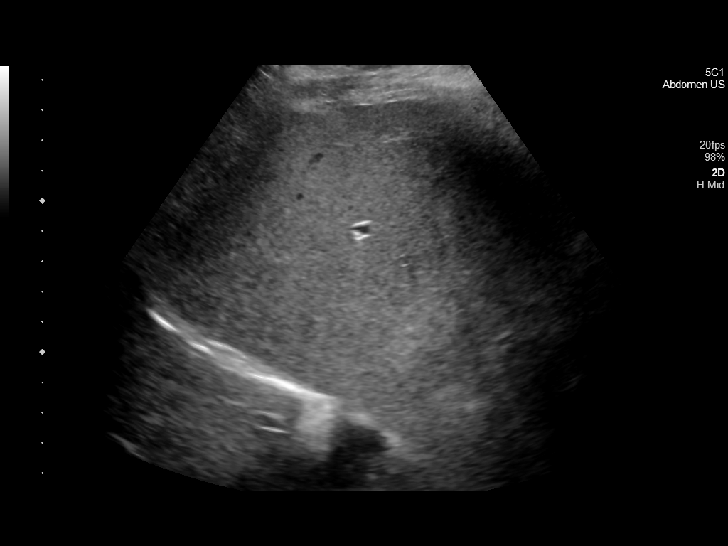
[im 41/49]
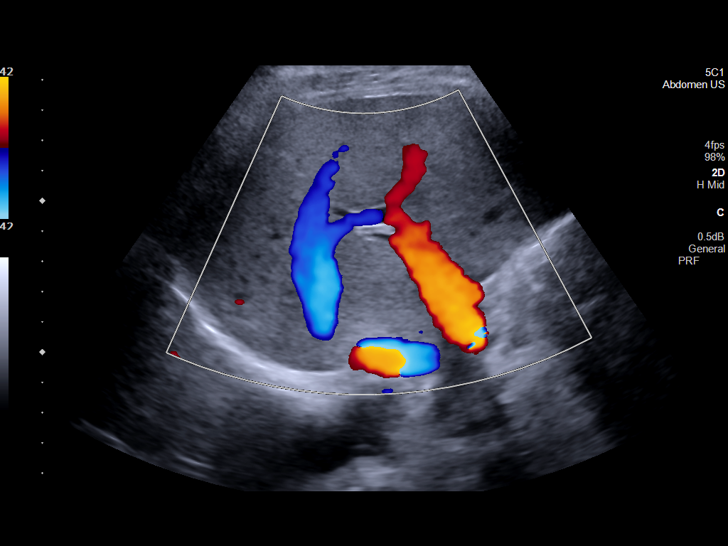
[im 45/49]
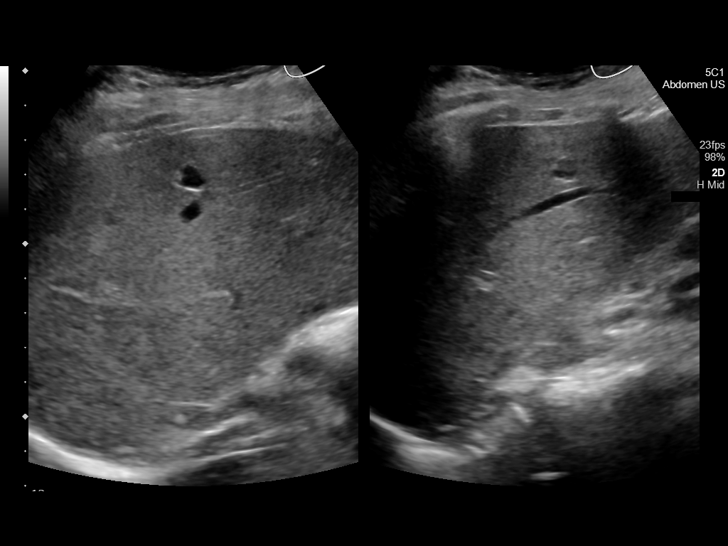
[im 49/49]
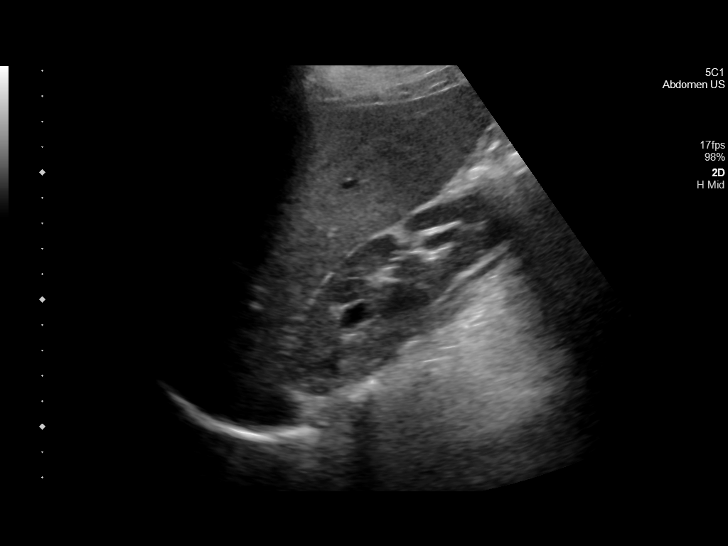

[14 of 25 positions shown; findings below may reference images not displayed]

FINDINGS: Gallbladder:

Cholecystectomy

Common bile duct:

Diameter: 4 mm, normal

Liver:

Few small cysts measuring up to 1.1 cm. Possible surface contour
nodularity. Minimally increased parenchymal echogenicity. Portal
vein is patent on color Doppler imaging with normal direction of
blood flow towards the liver.

Other: None.
IMPRESSION: Mild liver surface contour nodularity raising possibility of
cirrhosis.

Minimally increased liver echogenicity may reflect steatosis or
chronic liver disease.

## 2023-10-07 ENCOUNTER — Telehealth: Payer: Self-pay | Admitting: Internal Medicine

## 2023-10-07 NOTE — Telephone Encounter (Signed)
11/02/21-present MR uploaded to [facility]

## 2024-05-26 NOTE — Progress Notes (Deleted)
 [facility] [address] [place], [place] [address]  Pulmonary Sleep Medicine   Office Visit Note  Patient Name: Caroline Serrano DOB: 25-Apr-1956 MRN [id number]    Chief Complaint: Obstructive Sleep Apnea visit  Brief History:  Caroline Serrano is seen today for a follow up visit for CPAP@   cmH2O. The patient has a 17 year history of sleep apnea. Patient *** using PAP nightly.  The patient feels *** after sleeping with PAP.  The patient reports *** from PAP use. Reported sleepiness is  *** and the Epworth Sleepiness Score is *** out of 24. The patient *** take naps. The patient complains of the following: ***  The compliance download shows  compliance with an average use time of *** hours. The AHI is ***  The patient *** of limb movements disrupting sleep.  ROS  General: (-) fever, (-) chills, (-) night sweat Nose and Sinuses: (-) nasal stuffiness or itchiness, (-) postnasal drip, (-) nosebleeds, (-) sinus trouble. Mouth and Throat: (-) sore throat, (-) hoarseness. Neck: (-) swollen glands, (-) enlarged thyroid, (-) neck pain. Respiratory: *** cough, *** shortness of breath, *** wheezing. Neurologic: *** numbness, *** tingling. Psychiatric: *** anxiety, *** depression   Current Medication: Outpatient Encounter Medications as of 05/29/2024  Medication Sig   amLODipine  (NORVASC ) 10 MG tablet Take 10 mg by mouth daily.   amLODipine  (NORVASC ) 5 MG tablet Take 5 mg by mouth daily.   aspirin EC 81 MG tablet Take 81 mg by mouth daily.   atorvastatin (LIPITOR) 20 MG tablet Take 20 mg by mouth at bedtime.   Biotin 1 MG CAPS Take 5 mg by mouth 2 (two) times daily.   buPROPion  (WELLBUTRIN  XL) 300 MG 24 hr tablet Take 300 mg by mouth daily. (for a total of 450 mg)   buPROPion  (WELLBUTRIN  XL) 300 MG 24 hr tablet Take by mouth.   chlorthalidone (HYGROTON) 25 MG tablet Take 50 mg by mouth daily.   chlorthalidone (HYGROTON) 50 MG tablet Take 50 mg by mouth daily.    cyanocobalamin (,VITAMIN B-12,) 1000 MCG/ML injection Inject 1 ml intramuscularly once a month   cyanocobalamin 1000 MCG tablet Take 5,000 mcg by mouth daily. (Patient not taking: No sig reported)   ferrous sulfate 325 (65 FE) MG EC tablet Take 1 tablet by mouth daily.   folic acid  (FOLVITE ) 1 MG tablet Take 1 tablet (1 mg total) by mouth daily.   hydrOXYzine (ATARAX/VISTARIL) 10 MG tablet Take 10 mg by mouth 3 (three) times daily as needed.   lisinopril  (PRINIVIL ,ZESTRIL ) 40 MG tablet Take 40 mg by mouth daily.   metoprolol  succinate (TOPROL -XL) 25 MG 24 hr tablet Take 25 mg by mouth daily.   Multiple Vitamin (MULTIVITAMIN WITH MINERALS) TABS tablet Take 1 tablet by mouth daily.   testosterone cypionate (DEPOTESTOSTERONE CYPIONATE) 200 MG/ML injection Inject 6 mg as directed once a week.    thiamine  100 MG tablet Take 1 tablet (100 mg total) by mouth daily.   traZODone  (DESYREL ) 100 MG tablet Take 100 mg by mouth at bedtime as needed for sleep.   venlafaxine  XR (EFFEXOR -XR) 75 MG 24 hr capsule Take 225 mg by mouth daily with breakfast. (Patient not taking: No sig reported)   venlafaxine  XR (EFFEXOR -XR) 75 MG 24 hr capsule Take by mouth.   No facility-administered encounter medications on file as of 05/29/2024.    Surgical History: Past Surgical History:  Procedure Laterality Date   GASTRIC BYPASS      Medical History:  Past Medical History:  Diagnosis Date   Anxiety    Depression     Family History: Non contributory to the present illness  Social History: Social History   Socioeconomic History   Marital status: Divorced    Spouse name: Not on file   Number of children: Not on file   Years of education: Not on file   Highest education level: Not on file  Occupational History   Not on file  Tobacco Use   Smoking status: Never   Smokeless tobacco: Never  Vaping Use   Vaping status: Never Used  Substance and Sexual Activity   Alcohol use: Yes    Comment: only occasional    Drug use: No   Sexual activity: Not on file  Other Topics Concern   Not on file  Social History Narrative   Not on file   Social Drivers of Health   Financial Resource Strain: Low Risk  (09/28/2023)   Received from [facility]   Overall Financial Resource Strain (CARDIA)    Difficulty of Paying Living Expenses: Not hard at all  Food Insecurity: No Food Insecurity (09/28/2023)   Received from [facility]   Hunger Vital Sign    Within the past 12 months, you worried that your food would run out before you got the money to buy more.: Never true    Within the past 12 months, the food you bought just didn't last and you didn't have money to get more.: Never true  Transportation Needs: No Transportation Needs (09/28/2023)   Received from [facility]   PRAPARE - Transportation    Lack of Transportation (Medical): No    Lack of Transportation (Non-Medical): No  Physical Activity: Not on file  Stress: Not on file  Social Connections: Not on file  Intimate Partner Violence: Not on file    Vital Signs: There were no vitals taken for this visit. There is no height or weight on file to calculate BMI.    Examination: General Appearance: The patient is well-developed, well-nourished, and in no distress. Neck Circumference: *** Skin: Gross inspection of skin unremarkable. Head: normocephalic, no gross deformities. Eyes: no gross deformities noted. ENT: ears appear grossly normal Neurologic: Alert and oriented. No involuntary movements.  STOP BANG RISK ASSESSMENT S (snore) Have you been told that you snore?     YES/N   T (tired) Are you often tired, fatigued, or sleepy during the day?   YES/NO  O (obstruction) Do you stop breathing, choke, or gasp during sleep? YES/NO   P (pressure) Do you have or are you being treated for high blood pressure? YES/NO   B (BMI) Is your body index greater than 35 kg/m? YES/NO   A (age) Are you 80 years old or older? YES/NO   N  (neck) Do you have a neck circumference greater than 16 inches?   YES/NO   G (gender) Are you a female? YES/NO   TOTAL STOP/BANG "YES" ANSWERS        A STOP-Bang score of 2 or less is considered low risk, and a score of 5 or more is high risk for having either moderate or severe OSA. For people who score 3 or 4, doctors may need to perform further assessment to determine how likely they are to have OSA.         EPWORTH SLEEPINESS SCALE:  Scale:  (0)= no chance of dozing; (1)= slight chance of dozing; (2)= moderate chance of dozing; (3)=  high chance of dozing  Chance  Situtation    Sitting and reading: ***    Watching TV: ***    Sitting Inactive in public: ***    As a passenger in car: ***      Lying down to rest: ***    Sitting and talking: ***    Sitting quielty after lunch: ***    In a car, stopped in traffic: ***   TOTAL SCORE:   *** out of 24    SLEEP STUDIES:  PSG (05/28/2022)-AHI 19.8/Hr.  REM  AHI 81.2/Hr  Min SP02  84%   CPAP COMPLIANCE DATA:  Date Range: ***  Average Daily Use: *** hours  Median Use: ***  Compliance for > 4 Hours: *** days  AHI: *** respiratory events per hour  Days Used: ***  Mask Leak: ***  95th Percentile Pressure: ***         LABS: No results found for this or any previous visit (from the past 2160 hours).  Radiology: MR BRAIN WO CONTRAST Result Date: 11/06/2020 CLINICAL DATA:  Seizure EXAM: MRI HEAD WITHOUT CONTRAST TECHNIQUE: Multiplanar, multiecho pulse sequences of the brain and surrounding structures were obtained without intravenous contrast. COMPARISON:  CT head 11/05/2020 FINDINGS: Brain: Image quality degraded by moderate to extensive motion which became progressive as the study progressed. No acute infarct on diffusion-weighted imaging. Mild white matter changes. Negative for mass or fluid collection. Vascular: Normal arterial flow voids Skull and upper cervical spine: Negative Sinuses/Orbits: Paranasal  sinuses clear.  Negative orbit Other: None IMPRESSION: Image quality degraded by significant motion. No acute abnormality identified. Electronically Signed   By: Carlin Gaskins M.D.   On: 11/06/2020 17:12   EEG Result Date: 11/06/2020 Yadav, Priyanka O, MD     11/06/2020  3:42 PM Patient Name: Caroline Serrano MRN: [id number] Epilepsy Attending: Arlin MALVA Krebs Referring Physician/Provider: Dr. Delayne Solian Date: 11/06/2020 Duration: 21.55 minutes Patient history: 68 year old female with history of alcohol abuse who presented with witnessed seizure.  EEG to evaluate for seizures. Level of alertness: Awake AEDs during EEG study: Ativan  Technical aspects: This EEG study was done with scalp electrodes positioned according to the 10-20 International system of electrode placement. Electrical activity was acquired at a sampling rate of 500Hz  and reviewed with a high frequency filter of 70Hz  and a low frequency filter of 1Hz . EEG data were recorded continuously and digitally stored. Description: No clear posterior dominant rhythm was seen.  There is an excessive amount of 15 to 18 Hz beta activity  distributed symmetrically and diffusely. Hyperventilation and photic stimulation were not performed.   ABNORMALITY -Excessive beta, generalized IMPRESSION: This study is within normal limits. No seizures or epileptiform discharges were seen throughout the recording. The excessive beta activity seen in the background is most likely due to the effect of benzodiazepine and is a benign EEG pattern. Priyanka MALVA Krebs   US  Abdomen Limited RUQ (LIVER/GB) Result Date: 11/06/2020 CLINICAL DATA:  Elevated liver enzymes EXAM: ULTRASOUND ABDOMEN LIMITED RIGHT UPPER QUADRANT COMPARISON:  None. FINDINGS: Gallbladder: Cholecystectomy Common bile duct: Diameter: 4 mm, normal Liver: Few small cysts measuring up to 1.1 cm. Possible surface contour nodularity. Minimally increased parenchymal echogenicity. Portal vein is patent on color  Doppler imaging with normal direction of blood flow towards the liver. Other: None. IMPRESSION: Mild liver surface contour nodularity raising possibility of cirrhosis. Minimally increased liver echogenicity may reflect steatosis or chronic liver disease. Electronically Signed   By: Santina Tobie HERO.D.  On: 11/06/2020 09:21   CT HEAD WO CONTRAST Result Date: 11/05/2020 CLINICAL DATA:  Nonverbal jerking motion EXAM: CT HEAD WITHOUT CONTRAST TECHNIQUE: Contiguous axial images were obtained from the base of the skull through the vertex without intravenous contrast. COMPARISON:  None. FINDINGS: Brain: No acute territorial infarction, hemorrhage or intracranial mass. Mild to moderate atrophy. Nonenlarged ventricles. Vascular: No hyperdense vessels.  Carotid vascular calcification Skull: Normal. Negative for fracture or focal lesion. Sinuses/Orbits: No acute finding. Other: Mild right forehead scalp edema. Incomplete union posterior arch C1. Hypoplastic appearing right mandibular head. IMPRESSION: 1. No CT evidence for acute intracranial abnormality. 2. Atrophy. Electronically Signed   By: Luke Bun M.D.   On: 11/05/2020 20:36   DG Chest 1 View Result Date: 11/05/2020 CLINICAL DATA:  Altered mental status, intoxication EXAM: CHEST  1 VIEW COMPARISON:  None. FINDINGS: Normal heart size. Normal mediastinal contour. No pneumothorax. No pleural effusion. Lungs appear clear, with no acute consolidative airspace disease and no pulmonary edema. IMPRESSION: No active disease. Electronically Signed   By: Selinda DELENA Blue M.D.   On: 11/05/2020 20:07    No results found.  No results found.    Assessment and Plan: Patient Active Problem List   Diagnosis Date Noted   OSA (obstructive sleep apnea) 05/04/2022   Severe recurrent major depression without psychotic features (HCC) 11/07/2020   Alcohol use disorder, moderate, dependence (HCC) 11/06/2020   Acute metabolic encephalopathy 11/06/2020   Alcohol withdrawal  seizure with complication, with unspecified complication (HCC) 11/06/2020   Lactic acidosis 11/06/2020   Elevated liver enzymes 11/06/2020   Sepsis (HCC) 11/06/2020   Seizure (HCC) 11/06/2020   HTN (hypertension) 11/06/2020   Anxiety 11/06/2020   Scalp laceration, initial encounter 11/06/2020      The patient *** tolerate PAP and reports *** benefit from PAP use. The patient was reminded how to *** and advised to ***. The patient was also counselled on ***. The compliance is ***. The AHI is ***.   ***  General Counseling: I have discussed the findings of the evaluation and examination with Caroline Serrano.  I have also discussed any further diagnostic evaluation thatmay be needed or ordered today. Caroline Serrano verbalizes understanding of the findings of todays visit. We also reviewed his medications today and discussed drug interactions and side effects including but not limited excessive drowsiness and altered mental states. We also discussed that there is always a risk not just to him but also people around him. he has been encouraged to call the office with any questions or concerns that should arise related to todays visit.  No orders of the defined types were placed in this encounter.       I have personally obtained a history, examined the patient, evaluated laboratory and imaging results, formulated the assessment and plan and placed orders.  Elfreda DELENA Bathe, MD [facility] Diplomate ABMS Pulmonary Critical Care Medicine and Sleep Medicine

## 2024-05-29 ENCOUNTER — Ambulatory Visit (INDEPENDENT_AMBULATORY_CARE_PROVIDER_SITE_OTHER): Admitting: Internal Medicine

## 2024-05-29 VITALS — BP 134/83 | HR 98 | Resp 16 | Ht 62.0 in | Wt 139.0 lb

## 2024-05-29 DIAGNOSIS — G4733 Obstructive sleep apnea (adult) (pediatric): Secondary | ICD-10-CM

## 2024-05-29 DIAGNOSIS — Z7189 Other specified counseling: Secondary | ICD-10-CM

## 2024-05-29 NOTE — Progress Notes (Signed)
 Sleep Medicine   Office Visit  Patient Name: Caroline Serrano DOB: 09-24-1956 MRN [id number]    Chief Complaint: sleep evaluation   Brief History:  Caroline Serrano presents for a follow up visit for sleep re-evaluation. Patient has a 17 year history of sleep apnea but has not used a PAP in years. Sleep quality is poor. This is noted every night. The patient's bed partner reports some snoring and witnessed apneic pauses at night. The patient relates the following symptoms: occasional mild headaches, excessive fatigue, excessive daytime sleepiness, and some memory issues are also present. The patient goes to sleep at 1030 pm and wakes up between 0100 and 0200 am and is unable to fall back asleep till about 0330 am. Patient officially wakes up at 0630 am.  Sleep quality is worse when outside home environment.  Patient has noted no significant movement of his legs at night that would disrupt his sleep.  The patient  relates no unusual behavior during the night.  The patient relates depression and anxiety as a history of psychiatric problems. The Epworth Sleepiness Score is 10 out of 24 .  The patient relates  Cardiovascular risk factors include: HTN.    ROS  General: (-) fever, (-) chills, (-) night sweat Nose and Sinuses: (-) nasal stuffiness or itchiness, (-) postnasal drip, (-) nosebleeds, (-) sinus trouble. Mouth and Throat: (-) sore throat, (-) hoarseness. Neck: (-) swollen glands, (-) enlarged thyroid, (-) neck pain. Respiratory: - cough, - shortness of breath, - wheezing. Neurologic: - numbness, - tingling. Psychiatric: + anxiety, + depression Sleep behavior: -sleep paralysis -hypnogogic hallucinations -dream enactment      -vivid dreams -cataplexy -night terrors -sleep walking   Current Medication: Outpatient Encounter Medications as of 05/29/2024  Medication Sig   ARIPiprazole (ABILIFY) 2 MG tablet Take 2 mg by mouth at bedtime.   gabapentin (NEURONTIN) 300 MG capsule Take 300  mg by mouth 3 (three) times daily.   amLODipine  (NORVASC ) 5 MG tablet Take 5 mg by mouth daily.   atorvastatin (LIPITOR) 20 MG tablet Take 20 mg by mouth at bedtime.   Biotin 1 MG CAPS Take 5 mg by mouth 2 (two) times daily.   buPROPion  (WELLBUTRIN  XL) 300 MG 24 hr tablet Take by mouth.   chlorthalidone (HYGROTON) 50 MG tablet Take 50 mg by mouth daily.   cyanocobalamin (,VITAMIN B-12,) 1000 MCG/ML injection Inject 1 ml intramuscularly once a month   cyanocobalamin 1000 MCG tablet Take 5,000 mcg by mouth daily. (Patient not taking: No sig reported)   ferrous sulfate 325 (65 FE) MG EC tablet Take 1 tablet by mouth daily.   folic acid  (FOLVITE ) 1 MG tablet Take 1 tablet (1 mg total) by mouth daily.   hydrOXYzine (ATARAX/VISTARIL) 10 MG tablet Take 10 mg by mouth 3 (three) times daily as needed.   lisinopril  (PRINIVIL ,ZESTRIL ) 40 MG tablet Take 40 mg by mouth daily.   Multiple Vitamin (MULTIVITAMIN WITH MINERALS) TABS tablet Take 1 tablet by mouth daily.   testosterone cypionate (DEPOTESTOSTERONE CYPIONATE) 200 MG/ML injection Inject 6 mg as directed once a week.    thiamine  100 MG tablet Take 1 tablet (100 mg total) by mouth daily.   traZODone  (DESYREL ) 100 MG tablet Take 100 mg by mouth at bedtime as needed for sleep.   venlafaxine  XR (EFFEXOR -XR) 75 MG 24 hr capsule Take 225 mg by mouth daily with breakfast. (Patient not taking: No sig reported)   venlafaxine  XR (EFFEXOR -XR) 75 MG 24 hr capsule Take by mouth.   [  DISCONTINUED] amLODipine  (NORVASC ) 10 MG tablet Take 10 mg by mouth daily.   [DISCONTINUED] aspirin EC 81 MG tablet Take 81 mg by mouth daily.   [DISCONTINUED] buPROPion  (WELLBUTRIN  XL) 300 MG 24 hr tablet Take 300 mg by mouth daily. (for a total of 450 mg)   [DISCONTINUED] chlorthalidone (HYGROTON) 25 MG tablet Take 50 mg by mouth daily.   [DISCONTINUED] metoprolol  succinate (TOPROL -XL) 25 MG 24 hr tablet Take 25 mg by mouth daily.   No facility-administered encounter medications on  file as of 05/29/2024.    Surgical History: Past Surgical History:  Procedure Laterality Date   GASTRIC BYPASS      Medical History: Past Medical History:  Diagnosis Date   Anxiety    Depression     Family History: Non contributory to the present illness  Social History: Social History   Socioeconomic History   Marital status: Divorced    Spouse name: Not on file   Number of children: Not on file   Years of education: Not on file   Highest education level: Not on file  Occupational History   Not on file  Tobacco Use   Smoking status: Never   Smokeless tobacco: Never  Vaping Use   Vaping status: Never Used  Substance and Sexual Activity   Alcohol use: Yes    Comment: only occasional   Drug use: No   Sexual activity: Not on file  Other Topics Concern   Not on file  Social History Narrative   Not on file   Social Drivers of Health   Financial Resource Strain: Low Risk  (09/28/2023)   Received from [facility]   Overall Financial Resource Strain (CARDIA)    Difficulty of Paying Living Expenses: Not hard at all  Food Insecurity: No Food Insecurity (09/28/2023)   Received from [facility]   Hunger Vital Sign    Within the past 12 months, you worried that your food would run out before you got the money to buy more.: Never true    Within the past 12 months, the food you bought just didn't last and you didn't have money to get more.: Never true  Transportation Needs: No Transportation Needs (09/28/2023)   Received from [facility]   PRAPARE - Transportation    Lack of Transportation (Medical): No    Lack of Transportation (Non-Medical): No  Physical Activity: Not on file  Stress: Not on file  Social Connections: Not on file  Intimate Partner Violence: Not on file    Vital Signs: Blood pressure 134/83, pulse 98, resp. rate 16, height 5' 2 (1.575 m), weight 139 lb (63 kg), SpO2 98%. Body mass index is 25.42 kg/m.   Examination: General  Appearance: The patient is well-developed, well-nourished, and in no distress. Neck Circumference: 37 cm Skin: Gross inspection of skin unremarkable. Head: normocephalic, no gross deformities. Eyes: no gross deformities noted. ENT: ears appear grossly normal Neurologic: Alert and oriented. No involuntary movements.    STOP BANG RISK ASSESSMENT S (snore) Have you been told that you snore?     YES   T (tired) Are you often tired, fatigued, or sleepy during the day?   YES  O (obstruction) Do you stop breathing, choke, or gasp during sleep? YES   P (pressure) Do you have or are you being treated for high blood pressure? YES   B (BMI) Is your body index greater than 35 kg/m? NO   A (age) Are you 11 years old  or older? YES   N (neck) Do you have a neck circumference greater than 16 inches?   NO   G (gender) Are you a female? YES/NO   TOTAL STOP/BANG "YES" ANSWERS                                                                A STOP-Bang score of 2 or less is considered low risk, and a score of 5 or more is high risk for having either moderate or severe OSA. For people who score 3 or 4, doctors may need to perform further assessment to determine how likely they are to have OSA.         EPWORTH SLEEPINESS SCALE:  Scale:  (0)= no chance of dozing; (1)= slight chance of dozing; (2)= moderate chance of dozing; (3)= high chance of dozing  Chance  Situtation    Sitting and reading: 2    Watching TV: 2    Sitting Inactive in public: 0    As a passenger in car: 1      Lying down to rest: 2    Sitting and talking: 0    Sitting quielty after lunch: 3    In a car, stopped in traffic: 0   TOTAL SCORE:   10 out of 24    SLEEP STUDIES:  PSG (05/28/2022)-AHI 19.8/Hr.  REM  AHI 81.2/Hr  Min SP02  84%   LABS: No results found for this or any previous visit (from the past 2160 hours).  Radiology: MR BRAIN WO CONTRAST Result Date: 11/06/2020 CLINICAL DATA:  Seizure EXAM:  MRI HEAD WITHOUT CONTRAST TECHNIQUE: Multiplanar, multiecho pulse sequences of the brain and surrounding structures were obtained without intravenous contrast. COMPARISON:  CT head 11/05/2020 FINDINGS: Brain: Image quality degraded by moderate to extensive motion which became progressive as the study progressed. No acute infarct on diffusion-weighted imaging. Mild white matter changes. Negative for mass or fluid collection. Vascular: Normal arterial flow voids Skull and upper cervical spine: Negative Sinuses/Orbits: Paranasal sinuses clear.  Negative orbit Other: None IMPRESSION: Image quality degraded by significant motion. No acute abnormality identified. Electronically Signed   By: Carlin Gaskins M.D.   On: 11/06/2020 17:12   EEG Result Date: 11/06/2020 Yadav, Priyanka O, MD     11/06/2020  3:42 PM Patient Name: Sandra Brents MRN: [id number] Epilepsy Attending: Arlin MALVA Krebs Referring Physician/Provider: Dr. Delayne Solian Date: 11/06/2020 Duration: 21.55 minutes Patient history: 68 year old female with history of alcohol abuse who presented with witnessed seizure.  EEG to evaluate for seizures. Level of alertness: Awake AEDs during EEG study: Ativan  Technical aspects: This EEG study was done with scalp electrodes positioned according to the 10-20 International system of electrode placement. Electrical activity was acquired at a sampling rate of 500Hz  and reviewed with a high frequency filter of 70Hz  and a low frequency filter of 1Hz . EEG data were recorded continuously and digitally stored. Description: No clear posterior dominant rhythm was seen.  There is an excessive amount of 15 to 18 Hz beta activity  distributed symmetrically and diffusely. Hyperventilation and photic stimulation were not performed.   ABNORMALITY -Excessive beta, generalized IMPRESSION: This study is within normal limits. No seizures or epileptiform discharges were seen throughout the recording. The excessive beta activity seen  in  the background is most likely due to the effect of benzodiazepine and is a benign EEG pattern. Priyanka MALVA Krebs   US  Abdomen Limited RUQ (LIVER/GB) Result Date: 11/06/2020 CLINICAL DATA:  Elevated liver enzymes EXAM: ULTRASOUND ABDOMEN LIMITED RIGHT UPPER QUADRANT COMPARISON:  None. FINDINGS: Gallbladder: Cholecystectomy Common bile duct: Diameter: 4 mm, normal Liver: Few small cysts measuring up to 1.1 cm. Possible surface contour nodularity. Minimally increased parenchymal echogenicity. Portal vein is patent on color Doppler imaging with normal direction of blood flow towards the liver. Other: None. IMPRESSION: Mild liver surface contour nodularity raising possibility of cirrhosis. Minimally increased liver echogenicity may reflect steatosis or chronic liver disease. Electronically Signed   By: Santina Blanch M.D.   On: 11/06/2020 09:21   CT HEAD WO CONTRAST Result Date: 11/05/2020 CLINICAL DATA:  Nonverbal jerking motion EXAM: CT HEAD WITHOUT CONTRAST TECHNIQUE: Contiguous axial images were obtained from the base of the skull through the vertex without intravenous contrast. COMPARISON:  None. FINDINGS: Brain: No acute territorial infarction, hemorrhage or intracranial mass. Mild to moderate atrophy. Nonenlarged ventricles. Vascular: No hyperdense vessels.  Carotid vascular calcification Skull: Normal. Negative for fracture or focal lesion. Sinuses/Orbits: No acute finding. Other: Mild right forehead scalp edema. Incomplete union posterior arch C1. Hypoplastic appearing right mandibular head. IMPRESSION: 1. No CT evidence for acute intracranial abnormality. 2. Atrophy. Electronically Signed   By: Luke Bun M.D.   On: 11/05/2020 20:36   DG Chest 1 View Result Date: 11/05/2020 CLINICAL DATA:  Altered mental status, intoxication EXAM: CHEST  1 VIEW COMPARISON:  None. FINDINGS: Normal heart size. Normal mediastinal contour. No pneumothorax. No pleural effusion. Lungs appear clear, with no acute consolidative  airspace disease and no pulmonary edema. IMPRESSION: No active disease. Electronically Signed   By: Selinda DELENA Blue M.D.   On: 11/05/2020 20:07    No results found.  No results found.    Assessment and Plan: Patient Active Problem List   Diagnosis Date Noted   CPAP use counseling 05/29/2024   OSA (obstructive sleep apnea) 05/04/2022   Severe recurrent major depression without psychotic features (HCC) 11/07/2020   Alcohol use disorder, moderate, dependence (HCC) 11/06/2020   Acute metabolic encephalopathy 11/06/2020   Alcohol withdrawal seizure with complication, with unspecified complication (HCC) 11/06/2020   Lactic acidosis 11/06/2020   Elevated liver enzymes 11/06/2020   Sepsis (HCC) 11/06/2020   Seizure (HCC) 11/06/2020   HTN (hypertension) 11/06/2020   Anxiety 11/06/2020   Scalp laceration, initial encounter 11/06/2020   1. OSA (obstructive sleep apnea) (Primary) Patient evaluation suggests high risk of sleep disordered breathing due to history of OSA, witnessed apnea, snoring, headaches, daytime sleepiness.  Patient has comorbid cardiovascular risk factors including: hypertension which could be exacerbated by pathologic sleep-disordered breathing.  Suggest: PSG to assess/treat the patient's sleep disordered breathing.   2. CPAP use counseling CPAP Counseling: had a lengthy discussion with the patient regarding the importance of PAP therapy in management of the sleep apnea. Patient appears to understand the risk factor reduction and also understands the risks associated with untreated sleep apnea. Patient will try to make a good faith effort to remain compliant with therapy. Also instructed the patient on proper cleaning of the device including the water must be changed daily if possible and use of distilled water is preferred. Patient understands that the machine should be regularly cleaned with appropriate recommended cleaning solutions that do not damage the PAP machine for  example given white vinegar and water rinses. Other methods  such as ozone treatment may not be as good as these simple methods to achieve cleaning.     General Counseling: I have discussed the findings of the evaluation and examination with Caroline Serrano.  I have also discussed any further diagnostic evaluation thatmay be needed or ordered today. Caroline Serrano verbalizes understanding of the findings of todays visit. We also reviewed his medications today and discussed drug interactions and side effects including but not limited excessive drowsiness and altered mental states. We also discussed that there is always a risk not just to him but also people around him. he has been encouraged to call the office with any questions or concerns that should arise related to todays visit.  No orders of the defined types were placed in this encounter.       I have personally obtained a history, evaluated the patient, evaluated pertinent data, formulated the assessment and plan and placed orders.   This patient was seen today by Lauraine Lay, PA-C in collaboration with Dr. Elfreda Bathe.   Elfreda DELENA Bathe, MD [facility] Diplomate ABMS Pulmonary and Critical Care Medicine Sleep medicine

## 2024-09-16 NOTE — Progress Notes (Unsigned)
 [facility] [address] [place], [place] [address]  Pulmonary Sleep Medicine   Office Visit Note  Patient Name: Caroline Serrano DOB: [birth year] MRN [id number]    Chief Complaint: Obstructive Sleep Apnea visit  Brief History:  Caroline Serrano is seen today for a follow up on APAP @ 10-17 cmH2O. The patient has a 17 year history of sleep apnea. Patient is not consistently using PAP nightly.  The patient feels somewhat rested after sleeping with PAP.  The patient reports benefiting from PAP use. Reported sleepiness is  slightly improved and the Epworth Sleepiness Score is 11 out of 24. The patient does take naps. The patient complains of the following: patient is still attempting to acclimate to using the PAP as this is the first machine. Patient will attempt to use PAP more to meet compliance.  The compliance download shows 12%compliance with an average use time of 1 hours 26 minutes. The AHI is 0.9  The patient does not complain of limb movements disrupting sleep.  ROS  General: (-) fever, (-) chills, (-) night sweat Nose and Sinuses: (-) nasal stuffiness or itchiness, (-) postnasal drip, (-) nosebleeds, (-) sinus trouble. Mouth and Throat: (-) sore throat, (-) hoarseness. Neck: (-) swollen glands, (-) enlarged thyroid, (-) neck pain. Respiratory: - cough, - shortness of breath, - wheezing. Neurologic: - numbness, -+ tingling. Psychiatric: + anxiety, + depression   Current Medication: Outpatient Encounter Medications as of 09/18/2024  Medication Sig   amLODipine  (NORVASC ) 5 MG tablet Take 5 mg by mouth daily.   ARIPiprazole (ABILIFY) 2 MG tablet Take 2 mg by mouth at bedtime.   atorvastatin (LIPITOR) 20 MG tablet Take 20 mg by mouth at bedtime.   Biotin 1 MG CAPS Take 5 mg by mouth 2 (two) times daily.   buPROPion  (WELLBUTRIN  XL) 300 MG 24 hr tablet Take by mouth.   chlorthalidone (HYGROTON) 50 MG tablet Take 50 mg by mouth daily.   cyanocobalamin (,VITAMIN  B-12,) 1000 MCG/ML injection Inject 1 ml intramuscularly once a month   cyanocobalamin 1000 MCG tablet Take 5,000 mcg by mouth daily. (Patient not taking: No sig reported)   ferrous sulfate 325 (65 FE) MG EC tablet Take 1 tablet by mouth daily.   folic acid  (FOLVITE ) 1 MG tablet Take 1 tablet (1 mg total) by mouth daily.   gabapentin (NEURONTIN) 300 MG capsule Take 300 mg by mouth 3 (three) times daily.   hydrOXYzine (ATARAX/VISTARIL) 10 MG tablet Take 10 mg by mouth 3 (three) times daily as needed.   lisinopril  (PRINIVIL ,ZESTRIL ) 40 MG tablet Take 40 mg by mouth daily.   Multiple Vitamin (MULTIVITAMIN WITH MINERALS) TABS tablet Take 1 tablet by mouth daily.   testosterone cypionate (DEPOTESTOSTERONE CYPIONATE) 200 MG/ML injection Inject 6 mg as directed once a week.    thiamine  100 MG tablet Take 1 tablet (100 mg total) by mouth daily.   traZODone  (DESYREL ) 100 MG tablet Take 100 mg by mouth at bedtime as needed for sleep.   venlafaxine  XR (EFFEXOR -XR) 75 MG 24 hr capsule Take 225 mg by mouth daily with breakfast. (Patient not taking: No sig reported)   venlafaxine  XR (EFFEXOR -XR) 75 MG 24 hr capsule Take by mouth.   No facility-administered encounter medications on file as of 09/18/2024.    Surgical History: Past Surgical History:  Procedure Laterality Date   GASTRIC BYPASS      Medical History: Past Medical History:  Diagnosis Date   Anxiety    Depression  Family History: Non contributory to the present illness  Social History: Social History   Socioeconomic History   Marital status: Divorced    Spouse name: Not on file   Number of children: Not on file   Years of education: Not on file   Highest education level: Not on file  Occupational History   Not on file  Tobacco Use   Smoking status: Never   Smokeless tobacco: Never  Vaping Use   Vaping status: Never Used  Substance and Sexual Activity   Alcohol use: Yes    Comment: only occasional   Drug use: No    Sexual activity: Not on file  Other Topics Concern   Not on file  Social History Narrative   Not on file   Social Drivers of Health   Financial Resource Strain: Low Risk (09/28/2023)   Received from [facility]   Overall Financial Resource Strain (CARDIA)    Difficulty of Paying Living Expenses: Not hard at all  Food Insecurity: No Food Insecurity (09/28/2023)   Received from [facility]   Hunger Vital Sign    Within the past 12 months, you worried that your food would run out before you got the money to buy more.: Never true    Within the past 12 months, the food you bought just didn't last and you didn't have money to get more.: Never true  Transportation Needs: No Transportation Needs (09/28/2023)   Received from [facility]   PRAPARE - Transportation    Lack of Transportation (Medical): No    Lack of Transportation (Non-Medical): No  Physical Activity: Not on file  Stress: Not on file  Social Connections: Not on file  Intimate Partner Violence: Not on file    Vital Signs: Blood pressure 132/87, pulse 83, resp. rate 16, height 5' 2 (1.575 m), weight 147 lb (66.7 kg), SpO2 99%. Body mass index is 26.89 kg/m.    Examination: General Appearance: The patient is well-developed, well-nourished, and in no distress. Neck Circumference: 37 cm Skin: Gross inspection of skin unremarkable. Head: normocephalic, no gross deformities. Eyes: no gross deformities noted. ENT: ears appear grossly normal Neurologic: Alert and oriented. No involuntary movements.  STOP BANG RISK ASSESSMENT S (snore) Have you been told that you snore?     YES   T (tired) Are you often tired, fatigued, or sleepy during the day?   YES  O (obstruction) Do you stop breathing, choke, or gasp during sleep? NO   P (pressure) Do you have or are you being treated for high blood pressure? YES   B (BMI) Is your body index greater than 35 kg/m? NO   A (age) Are you 65 years old or older? YES   N  (neck) Do you have a neck circumference greater than 16 inches?   NO   G (gender) Are you a female? YES   TOTAL STOP/BANG "YES" ANSWERS 5       A STOP-Bang score of 2 or less is considered low risk, and a score of 5 or more is high risk for having either moderate or severe OSA. For people who score 3 or 4, doctors may need to perform further assessment to determine how likely they are to have OSA.         EPWORTH SLEEPINESS SCALE:  Scale:  (0)= no chance of dozing; (1)= slight chance of dozing; (2)= moderate chance of dozing; (3)= high chance of dozing  Chance  Situtation  Sitting and reading: 2    Watching TV: 3    Sitting Inactive in public: 0    As a passenger in car: 1      Lying down to rest: 2    Sitting and talking: 0    Sitting quielty after lunch: 3    In a car, stopped in traffic: 0   TOTAL SCORE:   11 out of 24    SLEEP STUDIES:  PSG (05/28/2022) - AHI 19.8/hr, REM AHI 81.2/hr, min Sp02 84% PSG (06/09/2024 AHI 59.5/hr, min Sp02 70% Titration (06/22/2024) CPAP @ 10-17 cmH2O   CPAP COMPLIANCE DATA:  Date Range: 09/25/12025 - 09/13/2024  Average Daily Use: 2 hours 56 minutes  Median Use: 2 hours 52 minutes  Compliance for > 4 Hours: 12% days  AHI: 0.9 respiratory events per hour  Days Used: 24/49  Mask Leak: 18.3  95th Percentile Pressure: 12.8 cmH2O         LABS: No results found for this or any previous visit (from the past 2160 hours).  Radiology: MR BRAIN WO CONTRAST Result Date: 11/06/2020 CLINICAL DATA:  Seizure EXAM: MRI HEAD WITHOUT CONTRAST TECHNIQUE: Multiplanar, multiecho pulse sequences of the brain and surrounding structures were obtained without intravenous contrast. COMPARISON:  CT head 11/05/2020 FINDINGS: Brain: Image quality degraded by moderate to extensive motion which became progressive as the study progressed. No acute infarct on diffusion-weighted imaging. Mild white matter changes. Negative for mass or fluid  collection. Vascular: Normal arterial flow voids Skull and upper cervical spine: Negative Sinuses/Orbits: Paranasal sinuses clear.  Negative orbit Other: None IMPRESSION: Image quality degraded by significant motion. No acute abnormality identified. Electronically Signed   By: Carlin Gaskins M.D.   On: 11/06/2020 17:12   EEG Result Date: 11/06/2020 Yadav, Priyanka O, MD     11/06/2020  3:42 PM Patient Name: Sabrena Gavitt MRN: [id number] Epilepsy Attending: Arlin MALVA Krebs Referring Physician/Provider: Dr. Delayne Solian Date: 11/06/2020 Duration: 21.55 minutes Patient history: 68 year old female with history of alcohol abuse who presented with witnessed seizure.  EEG to evaluate for seizures. Level of alertness: Awake AEDs during EEG study: Ativan  Technical aspects: This EEG study was done with scalp electrodes positioned according to the 10-20 International system of electrode placement. Electrical activity was acquired at a sampling rate of 500Hz  and reviewed with a high frequency filter of 70Hz  and a low frequency filter of 1Hz . EEG data were recorded continuously and digitally stored. Description: No clear posterior dominant rhythm was seen.  There is an excessive amount of 15 to 18 Hz beta activity  distributed symmetrically and diffusely. Hyperventilation and photic stimulation were not performed.   ABNORMALITY -Excessive beta, generalized IMPRESSION: This study is within normal limits. No seizures or epileptiform discharges were seen throughout the recording. The excessive beta activity seen in the background is most likely due to the effect of benzodiazepine and is a benign EEG pattern. Priyanka MALVA Krebs   US  Abdomen Limited RUQ (LIVER/GB) Result Date: 11/06/2020 CLINICAL DATA:  Elevated liver enzymes EXAM: ULTRASOUND ABDOMEN LIMITED RIGHT UPPER QUADRANT COMPARISON:  None. FINDINGS: Gallbladder: Cholecystectomy Common bile duct: Diameter: 4 mm, normal Liver: Few small cysts measuring up to 1.1 cm.  Possible surface contour nodularity. Minimally increased parenchymal echogenicity. Portal vein is patent on color Doppler imaging with normal direction of blood flow towards the liver. Other: None. IMPRESSION: Mild liver surface contour nodularity raising possibility of cirrhosis. Minimally increased liver echogenicity may reflect steatosis or chronic liver disease. Electronically Signed  By: Praneil  Patel M.D.   On: 11/06/2020 09:21   CT HEAD WO CONTRAST Result Date: 11/05/2020 CLINICAL DATA:  Nonverbal jerking motion EXAM: CT HEAD WITHOUT CONTRAST TECHNIQUE: Contiguous axial images were obtained from the base of the skull through the vertex without intravenous contrast. COMPARISON:  None. FINDINGS: Brain: No acute territorial infarction, hemorrhage or intracranial mass. Mild to moderate atrophy. Nonenlarged ventricles. Vascular: No hyperdense vessels.  Carotid vascular calcification Skull: Normal. Negative for fracture or focal lesion. Sinuses/Orbits: No acute finding. Other: Mild right forehead scalp edema. Incomplete union posterior arch C1. Hypoplastic appearing right mandibular head. IMPRESSION: 1. No CT evidence for acute intracranial abnormality. 2. Atrophy. Electronically Signed   By: Luke Bun M.D.   On: 11/05/2020 20:36   DG Chest 1 View Result Date: 11/05/2020 CLINICAL DATA:  Altered mental status, intoxication EXAM: CHEST  1 VIEW COMPARISON:  None. FINDINGS: Normal heart size. Normal mediastinal contour. No pneumothorax. No pleural effusion. Lungs appear clear, with no acute consolidative airspace disease and no pulmonary edema. IMPRESSION: No active disease. Electronically Signed   By: Selinda DELENA Blue M.D.   On: 11/05/2020 20:07    No results found.  No results found.    Assessment and Plan: Patient Active Problem List   Diagnosis Date Noted   CPAP use counseling 05/29/2024   OSA (obstructive sleep apnea) 05/04/2022   Severe recurrent major depression without psychotic features  (HCC) 11/07/2020   Alcohol use disorder, moderate, dependence (HCC) 11/06/2020   Acute metabolic encephalopathy 11/06/2020   Alcohol withdrawal seizure with complication, with unspecified complication (HCC) 11/06/2020   Lactic acidosis 11/06/2020   Elevated liver enzymes 11/06/2020   Sepsis (HCC) 11/06/2020   Seizure (HCC) 11/06/2020   HTN (hypertension) 11/06/2020   Anxiety 11/06/2020   Scalp laceration, initial encounter 11/06/2020   1. OSA (obstructive sleep apnea) (Primary) The patient does tolerate PAP and reports  benefit from PAP use, however is not yet compliant. Lengthy discussion was had regarding risks of untreated apnea. Suggestions for increasing compliance discussed. . The patient was reminded how to clean equipment and advised to replace supplies routinely. The patient was also counselled on weight loss. The compliance is poor. The AHI is 0.9.   OSA- increase compliance. F/u 6wks   2. CPAP use counseling CPAP Counseling: had a lengthy discussion with the patient regarding the importance of PAP therapy in management of the sleep apnea. Patient appears to understand the risk factor reduction and also understands the risks associated with untreated sleep apnea. Patient will try to make a good faith effort to remain compliant with therapy. Also instructed the patient on proper cleaning of the device including the water must be changed daily if possible and use of distilled water is preferred. Patient understands that the machine should be regularly cleaned with appropriate recommended cleaning solutions that do not damage the PAP machine for example given white vinegar and water rinses. Other methods such as ozone treatment may not be as good as these simple methods to achieve cleaning.     General Counseling: I have discussed the findings of the evaluation and examination with Caroline Serrano.  I have also discussed any further diagnostic evaluation thatmay be needed or ordered today.  Caroline Serrano verbalizes understanding of the findings of todays visit. We also reviewed his medications today and discussed drug interactions and side effects including but not limited excessive drowsiness and altered mental states. We also discussed that there is always a risk not just to him but also people  around him. he has been encouraged to call the office with any questions or concerns that should arise related to todays visit.  No orders of the defined types were placed in this encounter.       I have personally obtained a history, examined the patient, evaluated laboratory and imaging results, formulated the assessment and plan and placed orders. This patient was seen today by Lauraine Lay, PA-C in collaboration with Dr. Elfreda Bathe.   Elfreda DELENA Bathe, MD [facility] Diplomate ABMS Pulmonary Critical Care Medicine and Sleep Medicine

## 2024-09-18 ENCOUNTER — Ambulatory Visit (INDEPENDENT_AMBULATORY_CARE_PROVIDER_SITE_OTHER): Admitting: Internal Medicine

## 2024-09-18 VITALS — BP 132/87 | HR 83 | Resp 16 | Ht 62.0 in | Wt 147.0 lb

## 2024-09-18 DIAGNOSIS — Z7189 Other specified counseling: Secondary | ICD-10-CM | POA: Diagnosis not present

## 2024-09-18 DIAGNOSIS — G4733 Obstructive sleep apnea (adult) (pediatric): Secondary | ICD-10-CM | POA: Diagnosis not present

## 2024-09-18 NOTE — Patient Instructions (Signed)

## 2024-10-23 ENCOUNTER — Ambulatory Visit: Admitting: Internal Medicine

## 2024-10-23 VITALS — BP 142/92 | HR 85 | Resp 16 | Ht 62.0 in | Wt 144.0 lb

## 2024-10-23 DIAGNOSIS — Z7189 Other specified counseling: Secondary | ICD-10-CM

## 2024-10-23 DIAGNOSIS — G4733 Obstructive sleep apnea (adult) (pediatric): Secondary | ICD-10-CM

## 2024-10-23 NOTE — Patient Instructions (Signed)

## 2024-10-23 NOTE — Progress Notes (Signed)
 [facility] [address] [place], [place] [address]  Pulmonary Sleep Medicine   Office Visit Note  Patient Name: Caroline Serrano DOB: 02-28-1956 MRN [id number]    Chief Complaint: Obstructive Sleep Apnea visit  Brief History:  Caroline Serrano is seen today for a follow up visit for APAP@ 10-17 cmH2O The patient has a 17 year history of sleep apnea. Patient is using PAP nightly.  The patient feels rested after sleeping with PAP.  The patient reports benefiting from PAP use. Reported sleepiness is  improved and the Epworth Sleepiness Score is 9 out of 24. The patient will occasionally take naps. The patient complains of the following: none.  The compliance download shows 93% compliance with an average use time of 4 hours 18 minutes. The AHI is 0.7.  The patient does not complain of limb movements disrupting sleep. The patient continues to require PAP therapy in order to eliminate sleep apnea.   ROS  General: (-) fever, (-) chills, (-) night sweat Nose and Sinuses: (-) nasal stuffiness or itchiness, (-) postnasal drip, (-) nosebleeds, (-) sinus trouble. Mouth and Throat: (-) sore throat, (-) hoarseness. Neck: (-) swollen glands, (-) enlarged thyroid, (-) neck pain. Respiratory: - cough, - shortness of breath, - wheezing. Neurologic: - numbness, - tingling. Psychiatric: + anxiety, - depression   Current Medication: Outpatient Encounter Medications as of 10/23/2024  Medication Sig   amLODipine  (NORVASC ) 5 MG tablet Take 5 mg by mouth daily.   ARIPiprazole (ABILIFY) 2 MG tablet Take 2 mg by mouth at bedtime.   atorvastatin (LIPITOR) 20 MG tablet Take 20 mg by mouth at bedtime.   Biotin 1 MG CAPS Take 5 mg by mouth 2 (two) times daily.   buPROPion  (WELLBUTRIN  XL) 300 MG 24 hr tablet Take by mouth.   chlorthalidone (HYGROTON) 50 MG tablet Take 50 mg by mouth daily.   cyanocobalamin (,VITAMIN B-12,) 1000 MCG/ML injection Inject 1 ml intramuscularly once a month    cyanocobalamin 1000 MCG tablet Take 5,000 mcg by mouth daily. (Patient not taking: No sig reported)   ferrous sulfate 325 (65 FE) MG EC tablet Take 1 tablet by mouth daily.   folic acid  (FOLVITE ) 1 MG tablet Take 1 tablet (1 mg total) by mouth daily.   gabapentin (NEURONTIN) 300 MG capsule Take 300 mg by mouth 3 (three) times daily.   hydrOXYzine (ATARAX/VISTARIL) 10 MG tablet Take 10 mg by mouth 3 (three) times daily as needed.   lisinopril  (PRINIVIL ,ZESTRIL ) 40 MG tablet Take 40 mg by mouth daily.   Multiple Vitamin (MULTIVITAMIN WITH MINERALS) TABS tablet Take 1 tablet by mouth daily.   testosterone cypionate (DEPOTESTOSTERONE CYPIONATE) 200 MG/ML injection Inject 6 mg as directed once a week.    thiamine  100 MG tablet Take 1 tablet (100 mg total) by mouth daily.   traZODone  (DESYREL ) 100 MG tablet Take 100 mg by mouth at bedtime as needed for sleep.   venlafaxine  XR (EFFEXOR -XR) 75 MG 24 hr capsule Take 225 mg by mouth daily with breakfast. (Patient not taking: No sig reported)   venlafaxine  XR (EFFEXOR -XR) 75 MG 24 hr capsule Take by mouth.   No facility-administered encounter medications on file as of 10/23/2024.    Surgical History: Past Surgical History:  Procedure Laterality Date   GASTRIC BYPASS      Medical History: Past Medical History:  Diagnosis Date   Anxiety    Depression     Family History: Non contributory to the present illness  Social History: Social  History   Socioeconomic History   Marital status: Divorced    Spouse name: Not on file   Number of children: Not on file   Years of education: Not on file   Highest education level: Not on file  Occupational History   Not on file  Tobacco Use   Smoking status: Never   Smokeless tobacco: Never  Vaping Use   Vaping status: Never Used  Substance and Sexual Activity   Alcohol use: Yes    Comment: only occasional   Drug use: No   Sexual activity: Not on file  Other Topics Concern   Not on file  Social  History Narrative   Not on file   Social Drivers of Health   Tobacco Use: Low Risk (10/23/2024)   Patient History    Smoking Tobacco Use: Never    Smokeless Tobacco Use: Never    Passive Exposure: Not on file  Financial Resource Strain: Low Risk (09/28/2023)   Received from [facility]   Overall Financial Resource Strain (CARDIA)    Difficulty of Paying Living Expenses: Not hard at all  Food Insecurity: No Food Insecurity (09/28/2023)   Received from [facility]   Epic    Within the past 12 months, you worried that your food would run out before you got the money to buy more.: Never true    Within the past 12 months, the food you bought just didn't last and you didn't have money to get more.: Never true  Transportation Needs: No Transportation Needs (09/28/2023)   Received from [facility]   PRAPARE - Transportation    Lack of Transportation (Medical): No    Lack of Transportation (Non-Medical): No  Physical Activity: Not on file  Stress: Not on file  Social Connections: Not on file  Intimate Partner Violence: Not At Risk (09/25/2024)   Received from [facility]   Epic    Within the last year, have you been afraid of your partner or ex-partner?: No    Within the last year, have you been humiliated or emotionally abused in other ways by your partner or ex-partner?: No    Within the last year, have you been kicked, hit, slapped, or otherwise physically hurt by your partner or ex-partner?: No    Within the last year, have you been raped or forced to have any kind of sexual activity by your partner or ex-partner?: No  Depression (PHQ2-9): Not on file  Alcohol Screen: Not on file  Housing: Not on file  Utilities: Low Risk (09/28/2023)   Received from [facility]   Utilities    Within the past 12 months, have you been unable to get utilities(heat, electricity) when it was really needed?: No  Health Literacy: Not on file    Vital Signs: Blood pressure (!)  142/92, pulse 85, resp. rate 16, height 5' 2 (1.575 m), weight 144 lb (65.3 kg), SpO2 98%. Body mass index is 26.34 kg/m.    Examination: General Appearance: The patient is well-developed, well-nourished, and in no distress. Neck Circumference: 37 cm Skin: Gross inspection of skin unremarkable. Head: normocephalic, no gross deformities. Eyes: no gross deformities noted. ENT: ears appear grossly normal Neurologic: Alert and oriented. No involuntary movements.  STOP BANG RISK ASSESSMENT S (snore) Have you been told that you snore?     NO   T (tired) Are you often tired, fatigued, or sleepy during the day?   NO  O (obstruction) Do you stop  breathing, choke, or gasp during sleep? NO   P (pressure) Do you have or are you being treated for high blood pressure? YES   B (BMI) Is your body index greater than 35 kg/m? NO   A (age) Are you 91 years old or older? YES   N (neck) Do you have a neck circumference greater than 16 inches?   NO   G (gender) Are you a female? NO   TOTAL STOP/BANG YES ANSWERS 2       A STOP-Bang score of 2 or less is considered low risk, and a score of 5 or more is high risk for having either moderate or severe OSA. For people who score 3 or 4, doctors may need to perform further assessment to determine how likely they are to have OSA.         EPWORTH SLEEPINESS SCALE:  Scale:  (0)= no chance of dozing; (1)= slight chance of dozing; (2)= moderate chance of dozing; (3)= high chance of dozing  Chance  Situtation    Sitting and reading: 2    Watching TV: 1    Sitting Inactive in public: 0    As a passenger in car: 1      Lying down to rest: 3    Sitting and talking: 0    Sitting quielty after lunch: 2    In a car, stopped in traffic: 0   TOTAL SCORE:   9 out of 24    SLEEP STUDIES:  PSG (05/28/2022) - AHI 19.8/hr, REM AHI 81.2/hr, min Sp02 84% PSG (06/09/2024 AHI 59.5/hr, min Sp02 70% Titration (06/22/2024) CPAP @ 10-17 cmH2O   CPAP  COMPLIANCE DATA:  Date Range: 09/23/2024-10/22/2024  Average Daily Use: 4 hours 18 minutes  Median Use: 4 hours 11 minutes  Compliance for > 4 Hours: 93%  AHI: 0.7 respiratory events per hour  Days Used: 30/30 days  Mask Leak: 7.4  95th Percentile Pressure: 12.7         LABS: No results found for this or any previous visit (from the past 2160 hours).  Radiology: MR BRAIN WO CONTRAST Result Date: 11/06/2020 CLINICAL DATA:  Seizure EXAM: MRI HEAD WITHOUT CONTRAST TECHNIQUE: Multiplanar, multiecho pulse sequences of the brain and surrounding structures were obtained without intravenous contrast. COMPARISON:  CT head 11/05/2020 FINDINGS: Brain: Image quality degraded by moderate to extensive motion which became progressive as the study progressed. No acute infarct on diffusion-weighted imaging. Mild white matter changes. Negative for mass or fluid collection. Vascular: Normal arterial flow voids Skull and upper cervical spine: Negative Sinuses/Orbits: Paranasal sinuses clear.  Negative orbit Other: None IMPRESSION: Image quality degraded by significant motion. No acute abnormality identified. Electronically Signed   By: Carlin Gaskins M.D.   On: 11/06/2020 17:12   EEG Result Date: 11/06/2020 Yadav, Priyanka O, MD     11/06/2020  3:42 PM Patient Name: Marquite Attwood MRN: [id number] Epilepsy Attending: Arlin MALVA Krebs Referring Physician/Provider: Dr. Delayne Solian Date: 11/06/2020 Duration: 21.55 minutes Patient history: 68 year old female with history of alcohol abuse who presented with witnessed seizure.  EEG to evaluate for seizures. Level of alertness: Awake AEDs during EEG study: Ativan  Technical aspects: This EEG study was done with scalp electrodes positioned according to the 10-20 International system of electrode placement. Electrical activity was acquired at a sampling rate of 500Hz  and reviewed with a high frequency filter of 70Hz  and a low frequency filter of 1Hz . EEG data  were recorded continuously and digitally stored. Description:  No clear posterior dominant rhythm was seen.  There is an excessive amount of 15 to 18 Hz beta activity  distributed symmetrically and diffusely. Hyperventilation and photic stimulation were not performed.   ABNORMALITY -Excessive beta, generalized IMPRESSION: This study is within normal limits. No seizures or epileptiform discharges were seen throughout the recording. The excessive beta activity seen in the background is most likely due to the effect of benzodiazepine and is a benign EEG pattern. Priyanka MALVA Krebs   US  Abdomen Limited RUQ (LIVER/GB) Result Date: 11/06/2020 CLINICAL DATA:  Elevated liver enzymes EXAM: ULTRASOUND ABDOMEN LIMITED RIGHT UPPER QUADRANT COMPARISON:  None. FINDINGS: Gallbladder: Cholecystectomy Common bile duct: Diameter: 4 mm, normal Liver: Few small cysts measuring up to 1.1 cm. Possible surface contour nodularity. Minimally increased parenchymal echogenicity. Portal vein is patent on color Doppler imaging with normal direction of blood flow towards the liver. Other: None. IMPRESSION: Mild liver surface contour nodularity raising possibility of cirrhosis. Minimally increased liver echogenicity may reflect steatosis or chronic liver disease. Electronically Signed   By: Santina Blanch M.D.   On: 11/06/2020 09:21   CT HEAD WO CONTRAST Result Date: 11/05/2020 CLINICAL DATA:  Nonverbal jerking motion EXAM: CT HEAD WITHOUT CONTRAST TECHNIQUE: Contiguous axial images were obtained from the base of the skull through the vertex without intravenous contrast. COMPARISON:  None. FINDINGS: Brain: No acute territorial infarction, hemorrhage or intracranial mass. Mild to moderate atrophy. Nonenlarged ventricles. Vascular: No hyperdense vessels.  Carotid vascular calcification Skull: Normal. Negative for fracture or focal lesion. Sinuses/Orbits: No acute finding. Other: Mild right forehead scalp edema. Incomplete union posterior arch C1.  Hypoplastic appearing right mandibular head. IMPRESSION: 1. No CT evidence for acute intracranial abnormality. 2. Atrophy. Electronically Signed   By: Luke Bun M.D.   On: 11/05/2020 20:36   DG Chest 1 View Result Date: 11/05/2020 CLINICAL DATA:  Altered mental status, intoxication EXAM: CHEST  1 VIEW COMPARISON:  None. FINDINGS: Normal heart size. Normal mediastinal contour. No pneumothorax. No pleural effusion. Lungs appear clear, with no acute consolidative airspace disease and no pulmonary edema. IMPRESSION: No active disease. Electronically Signed   By: Selinda DELENA Blue M.D.   On: 11/05/2020 20:07    No results found.  No results found.    Assessment and Plan: Patient Active Problem List   Diagnosis Date Noted   CPAP use counseling 05/29/2024   OSA (obstructive sleep apnea) 05/04/2022   Severe recurrent major depression without psychotic features (HCC) 11/07/2020   Alcohol use disorder, moderate, dependence (HCC) 11/06/2020   Acute metabolic encephalopathy 11/06/2020   Alcohol withdrawal seizure with complication, with unspecified complication (HCC) 11/06/2020   Lactic acidosis 11/06/2020   Elevated liver enzymes 11/06/2020   Sepsis (HCC) 11/06/2020   Seizure (HCC) 11/06/2020   HTN (hypertension) 11/06/2020   Anxiety 11/06/2020   Scalp laceration, initial encounter 11/06/2020    1. OSA (obstructive sleep apnea) (Primary) The patient does tolerate PAP and reports  benefit from PAP use. The patient was reminded how to clean equipment and advised to replace supplies routinely. The patient was also counselled on weight loss. The compliance is very good. The AHI is 0.7.   OSA on cpap- controlled. Continue with excellent compliance with pap. CPAP continues to be medically necessary to treat this patient's OSA. F/u one year.    2. CPAP use counseling CPAP Counseling: had a lengthy discussion with the patient regarding the importance of PAP therapy in management of the sleep apnea.  Patient appears to understand the risk  factor reduction and also understands the risks associated with untreated sleep apnea. Patient will try to make a good faith effort to remain compliant with therapy. Also instructed the patient on proper cleaning of the device including the water must be changed daily if possible and use of distilled water is preferred. Patient understands that the machine should be regularly cleaned with appropriate recommended cleaning solutions that do not damage the PAP machine for example given white vinegar and water rinses. Other methods such as ozone treatment may not be as good as these simple methods to achieve cleaning.    General Counseling: I have discussed the findings of the evaluation and examination with Iyah.  I have also discussed any further diagnostic evaluation thatmay be needed or ordered today. Danae verbalizes understanding of the findings of todays visit. We also reviewed his medications today and discussed drug interactions and side effects including but not limited excessive drowsiness and altered mental states. We also discussed that there is always a risk not just to him but also people around him. he has been encouraged to call the office with any questions or concerns that should arise related to todays visit.  No orders of the defined types were placed in this encounter.       I have personally obtained a history, examined the patient, evaluated laboratory and imaging results, formulated the assessment and plan and placed orders. This patient was seen today by Lauraine Lay, PA-C in collaboration with Dr. Elfreda Bathe.   Elfreda DELENA Bathe, MD [facility] Diplomate ABMS Pulmonary Critical Care Medicine and Sleep Medicine
